# Patient Record
Sex: Male | Born: 1935 | Race: White | Hispanic: No | Marital: Married | Smoking: Never smoker
Health system: Southern US, Community
[De-identification: ages and names within clinical notes are randomized; demographics above are authoritative.]

## PROBLEM LIST (undated history)

## (undated) DIAGNOSIS — R29898 Other symptoms and signs involving the musculoskeletal system: Secondary | ICD-10-CM

## (undated) DIAGNOSIS — H3530 Unspecified macular degeneration: Secondary | ICD-10-CM

## (undated) DIAGNOSIS — H3320 Serous retinal detachment, unspecified eye: Secondary | ICD-10-CM

## (undated) DIAGNOSIS — C4491 Basal cell carcinoma of skin, unspecified: Secondary | ICD-10-CM

## (undated) DIAGNOSIS — K862 Cyst of pancreas: Secondary | ICD-10-CM

## (undated) DIAGNOSIS — R600 Localized edema: Secondary | ICD-10-CM

## (undated) DIAGNOSIS — I6529 Occlusion and stenosis of unspecified carotid artery: Secondary | ICD-10-CM

## (undated) DIAGNOSIS — R972 Elevated prostate specific antigen [PSA]: Secondary | ICD-10-CM

## (undated) DIAGNOSIS — M353 Polymyalgia rheumatica: Secondary | ICD-10-CM

## (undated) DIAGNOSIS — M48061 Spinal stenosis, lumbar region without neurogenic claudication: Secondary | ICD-10-CM

## (undated) DIAGNOSIS — I10 Essential (primary) hypertension: Secondary | ICD-10-CM

## (undated) DIAGNOSIS — Z87448 Personal history of other diseases of urinary system: Secondary | ICD-10-CM

## (undated) DIAGNOSIS — C4490 Unspecified malignant neoplasm of skin, unspecified: Secondary | ICD-10-CM

## (undated) DIAGNOSIS — R609 Edema, unspecified: Secondary | ICD-10-CM

## (undated) DIAGNOSIS — M1990 Unspecified osteoarthritis, unspecified site: Secondary | ICD-10-CM

## (undated) DIAGNOSIS — I499 Cardiac arrhythmia, unspecified: Secondary | ICD-10-CM

## (undated) HISTORY — PX: EYE SURGERY: SHX253

## (undated) HISTORY — DX: Occlusion and stenosis of unspecified carotid artery: I65.29

## (undated) HISTORY — PX: CARDIOVERSION: SHX1299

## (undated) HISTORY — DX: Elevated prostate specific antigen (PSA): R97.20

## (undated) HISTORY — DX: Other symptoms and signs involving the musculoskeletal system: R29.898

## (undated) HISTORY — DX: Essential (primary) hypertension: I10

## (undated) HISTORY — PX: FEMUR FRACTURE SURGERY: SHX633

## (undated) HISTORY — DX: Basal cell carcinoma of skin, unspecified: C44.91

## (undated) HISTORY — DX: Polymyalgia rheumatica: M35.3

## (undated) HISTORY — DX: Unspecified macular degeneration: H35.30

---
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
## 1983-06-14 HISTORY — PX: BACK SURGERY: SHX140

## 1993-06-13 HISTORY — PX: BRAIN SURGERY: SHX531

## 1999-08-09 ENCOUNTER — Encounter: Payer: Self-pay | Admitting: *Deleted

## 1999-08-09 ENCOUNTER — Ambulatory Visit (HOSPITAL_COMMUNITY): Admission: RE | Admit: 1999-08-09 | Discharge: 1999-08-09 | Payer: Self-pay | Admitting: *Deleted

## 2005-04-04 ENCOUNTER — Ambulatory Visit: Payer: Self-pay | Admitting: Cardiology

## 2006-03-22 ENCOUNTER — Ambulatory Visit: Payer: Self-pay | Admitting: Cardiology

## 2006-03-31 ENCOUNTER — Encounter: Payer: Self-pay | Admitting: Internal Medicine

## 2006-03-31 ENCOUNTER — Ambulatory Visit: Payer: Self-pay

## 2006-07-31 ENCOUNTER — Ambulatory Visit: Payer: Self-pay | Admitting: Cardiology

## 2007-03-23 ENCOUNTER — Ambulatory Visit: Payer: Self-pay | Admitting: Cardiology

## 2007-06-14 HISTORY — PX: HIP SURGERY: SHX245

## 2007-09-12 ENCOUNTER — Ambulatory Visit: Payer: Self-pay | Admitting: Cardiology

## 2007-10-11 ENCOUNTER — Ambulatory Visit: Payer: Self-pay | Admitting: Cardiology

## 2007-11-18 ENCOUNTER — Ambulatory Visit: Payer: Self-pay | Admitting: Cardiology

## 2007-11-19 ENCOUNTER — Inpatient Hospital Stay (HOSPITAL_COMMUNITY): Admission: AD | Admit: 2007-11-19 | Discharge: 2007-11-23 | Payer: Self-pay | Admitting: Orthopedic Surgery

## 2007-11-19 ENCOUNTER — Ambulatory Visit: Payer: Self-pay | Admitting: Cardiology

## 2007-11-23 ENCOUNTER — Inpatient Hospital Stay: Admission: AD | Admit: 2007-11-23 | Discharge: 2007-12-01 | Payer: Self-pay | Admitting: Internal Medicine

## 2008-06-13 HISTORY — PX: HIP SURGERY: SHX245

## 2008-12-23 DIAGNOSIS — I10 Essential (primary) hypertension: Secondary | ICD-10-CM | POA: Insufficient documentation

## 2008-12-23 DIAGNOSIS — C4490 Unspecified malignant neoplasm of skin, unspecified: Secondary | ICD-10-CM | POA: Insufficient documentation

## 2008-12-23 DIAGNOSIS — I6529 Occlusion and stenosis of unspecified carotid artery: Secondary | ICD-10-CM | POA: Insufficient documentation

## 2008-12-23 DIAGNOSIS — H3530 Unspecified macular degeneration: Secondary | ICD-10-CM | POA: Insufficient documentation

## 2008-12-23 DIAGNOSIS — I4891 Unspecified atrial fibrillation: Secondary | ICD-10-CM | POA: Insufficient documentation

## 2008-12-23 DIAGNOSIS — M353 Polymyalgia rheumatica: Secondary | ICD-10-CM | POA: Insufficient documentation

## 2008-12-23 DIAGNOSIS — R972 Elevated prostate specific antigen [PSA]: Secondary | ICD-10-CM | POA: Insufficient documentation

## 2008-12-31 ENCOUNTER — Ambulatory Visit: Payer: Self-pay | Admitting: Cardiology

## 2009-03-06 ENCOUNTER — Encounter: Payer: Self-pay | Admitting: Emergency Medicine

## 2009-03-07 ENCOUNTER — Inpatient Hospital Stay (HOSPITAL_COMMUNITY): Admission: EM | Admit: 2009-03-07 | Discharge: 2009-03-11 | Payer: Self-pay | Admitting: Orthopedic Surgery

## 2009-03-07 ENCOUNTER — Ambulatory Visit: Payer: Self-pay | Admitting: Cardiology

## 2009-03-09 ENCOUNTER — Ambulatory Visit: Payer: Self-pay | Admitting: Physical Medicine & Rehabilitation

## 2009-03-11 ENCOUNTER — Inpatient Hospital Stay (HOSPITAL_COMMUNITY)
Admission: RE | Admit: 2009-03-11 | Discharge: 2009-03-24 | Payer: Self-pay | Admitting: Physical Medicine & Rehabilitation

## 2009-03-11 ENCOUNTER — Ambulatory Visit: Payer: Self-pay | Admitting: Physical Medicine & Rehabilitation

## 2009-09-18 ENCOUNTER — Telehealth: Payer: Self-pay | Admitting: Cardiology

## 2009-09-28 ENCOUNTER — Encounter: Admission: RE | Admit: 2009-09-28 | Discharge: 2009-09-28 | Payer: Self-pay | Admitting: Orthopedic Surgery

## 2009-12-16 ENCOUNTER — Ambulatory Visit: Payer: Self-pay | Admitting: Cardiology

## 2010-06-13 HISTORY — PX: INGUINAL HERNIA REPAIR: SUR1180

## 2010-07-13 NOTE — Progress Notes (Signed)
Summary: procedure/stop coumadin  Phone Note From Other Clinic Call back at [phone]   Caller: Roberta/[facility] Summary of Call: having a CT Arthrogram(not schedule), needs to stop coumadin 3 days prior Initial call taken by: Migdalia Dk,  September 18, 2009 2:26 PM  Follow-up for Phone Call        ok Follow-up by: Gaylord Shih, MD, [facility],  September 22, 2009 9:40 AM     Appended Document: procedure/stop coumadin FAXED NOTE TO [place] [facility] LEFT MESSAGE IF DIDN'T RECIEVE TO CALL OFFICE./CY

## 2010-07-13 NOTE — Assessment & Plan Note (Signed)
Summary: [name]   Visit Type:  77 yr  Primary Provider:  [place] Ouch  CC:  pt recently had shingles.....sob...edema/legs...broke right hip 03/05/09.....denies any cp.  History of Present Illness: [name] returns today for evaluation and management his chronic atrial fibrillation, anticoagulation, hypertension, and lower extremity edema.  He recent had shingles involving the left mid thorax. He's also broken his right hip from a fall in September. He's walking on a cane. His balance is a little unsteady but he has had no further falls. His wife confirms this.  His Coumadin is followed by his primary care in [place]. He reports no hemorrhagic complications.  He does complain of some edema. He was given Lasix and potassium but he rarely takes it.  Current Medications (verified): 1)  Vitamin B-12 1000 Mcg Tabs (Cyanocobalamin) .[name] [name].. 1 Tab Once Daily 2)  Coumadin 5 Mg Tabs (Warfarin Sodium) .... As Directed By [facility] Office 3)  Cardizem Cd 240 Mg Xr24h-Cap (Diltiazem Hcl Coated Beads) .[name] [name].. 1 Tab Once Daily 4)  Calcium-Vitamin D 500-200 Mg-Unit Tabs (Calcium Carbonate-Vitamin D) .[name] [name].. 1 Tab Two Times A Day 5)  Vision Formula  Tabs (Multiple Vitamins-Minerals) .[name] [name].. 1 Tab Once Daily 6)  Benazepril Hcl 10 Mg Tabs (Benazepril Hcl) .[name] [name].. 1 Tab Once Daily 7)  Klor-Con 10 10 Meq Cr-Tabs (Potassium Chloride) .... As Needed 8)  Furosemide 40 Mg Tabs (Furosemide) .... As Needed  Allergies: 1)  ! Pcn  Past History:  Past Medical History: Last updated: Dec 30, 2008 ATRIAL FIBRILLATION (ICD-427.31) COUMADIN THERAPY (ICD-V58.61) CAROTID ARTERY STENOSIS (ICD-433.10) HYPERTENSION, UNSPECIFIED (ICD-401.9) PSA, INCREASED (ICD-790.93) POLYMYALGIA RHEUMATICA (ICD-725) MACULAR DEGENERATION (ICD-362.50) EPISTAXIS/HX OF (ICD-784.7) CARCINOMA, BASAL CELL (ICD-173.9) WEAKNESS/LOWER EXTREMITY (ICD-780.79) * HISTORY OF SMALL RIGHT MCA LACUNAR STROKE    Past Surgical History: Last updated: 12-30-2008 Open  reduction and internal fixation of the left  intertrochanteric femur fracture  [name] [name]. [name] [name], M.D.   Family History: Last updated: 12/30/2008 Father: Deceased in 30 of noncardiac causes. Mother: Deceased at age 75 due to stroke  Social History: Last updated: 12/30/08 Retired from [employer] Married  Tobacco Use - No.  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   Risk Factors: Smoking Status: never (2008/12/30)  Review of Systems       negative other than history of present illness.  Vital Signs:  Patient profile:   77 year old male Height:      69 inches Weight:      152 pounds BMI:     22.53 Pulse rate:   57 / minute Pulse rhythm:   irregular BP sitting:   128 / 70  (left arm) Cuff size:   large  Vitals Entered By: Danielle Rankin, CMA (December 16, 2009 2:21 PM)  Physical Exam  General:  frail, alert oriented x3 Head:  normocephalic and atraumatic Eyes:  PERRLA/EOM intact; conjunctiva and lids normal. Neck:  Neck supple, no JVD. No masses, thyromegaly or abnormal cervical nodes. Lungs:  Clear bilaterally to auscultation and percussion. Heart:  PMI nondisplaced, regular rate and rhythm, no gallop left carotid bruit Msk:  decreased ROM.   Pulses:  pulses normal in all 4 extremities Extremities:  1+ left pedal edema and 1+ right pedal edema.   Neurologic:  Alert and oriented x 3. Skin:  Intact without lesions or rashes. Psych:  Normal affect.   Impression & Recommendations:  Problem # 1:  ATRIAL FIBRILLATION (ICD-427.31) Assessment Unchanged  The following medications  were removed from the medication list:    Aspirin 81 Mg Tbec (Aspirin) .[name] [name]... Take one tablet by mouth daily His updated medication list for this problem includes:    Coumadin 5 Mg Tabs (Warfarin sodium) .[name] [name]... As directed by [place] [facility]  Orders: EKG w/ Interpretation (93000)  Problem # 2:  COUMADIN THERAPY (ICD-V58.61) Assessment:  Unchanged  Problem # 3:  CAROTID ARTERY STENOSIS (ICD-433.10) Assessment: Unchanged  The following medications were removed from the medication list:    Aspirin 81 Mg Tbec (Aspirin) .[name] [name]... Take one tablet by mouth daily His updated medication list for this problem includes:    Coumadin 5 Mg Tabs (Warfarin sodium) .[name] [name]... As directed by [place] office  Problem # 4:  HYPERTENSION, UNSPECIFIED (ICD-401.9)  The following medications were removed from the medication list:    Aspirin 81 Mg Tbec (Aspirin) .[name] [name]... Take one tablet by mouth daily His updated medication list for this problem includes:    Cardizem Cd 240 Mg Xr24h-cap (Diltiazem hcl coated beads) .[name] [name]... 1 tab once daily    Benazepril Hcl 10 Mg Tabs (Benazepril hcl) .[name] [name]... 1 tab once daily    Furosemide 40 Mg Tabs (Furosemide) .[name] [name]... As needed  Patient Instructions: 1)  Your physician recommends that you schedule a follow-up appointment in: YEAR WITH DR [name] [name] 2)  Your physician has recommended you make the following change in your medication: CONT TO TAKE KCL AND FUROSEMIDE AS NEEDED  Appended Document: [facility]     Allergies: 1)  ! Pcn   EKG  Procedure date:  12/16/2009  Findings:      chronic atrial fibrillation, no acute changes

## 2010-09-16 LAB — PROTIME-INR
INR: 2.06 — ABNORMAL HIGH (ref 0.00–1.49)
INR: 2.21 — ABNORMAL HIGH (ref 0.00–1.49)
INR: 2.61 — ABNORMAL HIGH (ref 0.00–1.49)
INR: 3.19 — ABNORMAL HIGH (ref 0.00–1.49)
INR: 3.19 — ABNORMAL HIGH (ref 0.00–1.49)
INR: 3.2 — ABNORMAL HIGH (ref 0.00–1.49)
INR: 3.66 — ABNORMAL HIGH (ref 0.00–1.49)
Prothrombin Time: 23.8 seconds — ABNORMAL HIGH (ref 11.6–15.2)
Prothrombin Time: 24.3 seconds — ABNORMAL HIGH (ref 11.6–15.2)
Prothrombin Time: 26.8 seconds — ABNORMAL HIGH (ref 11.6–15.2)
Prothrombin Time: 28.9 seconds — ABNORMAL HIGH (ref 11.6–15.2)
Prothrombin Time: 32.1 seconds — ABNORMAL HIGH (ref 11.6–15.2)
Prothrombin Time: 32.4 seconds — ABNORMAL HIGH (ref 11.6–15.2)
Prothrombin Time: 32.5 seconds — ABNORMAL HIGH (ref 11.6–15.2)
Prothrombin Time: 36.1 seconds — ABNORMAL HIGH (ref 11.6–15.2)

## 2010-09-16 LAB — CBC
HCT: 29.6 % — ABNORMAL LOW (ref 39.0–52.0)
Hemoglobin: 9.4 g/dL — ABNORMAL LOW (ref 13.0–17.0)
MCHC: 33.7 g/dL (ref 30.0–36.0)
MCV: 91.3 fL (ref 78.0–100.0)
RDW: 15.2 % (ref 11.5–15.5)
WBC: 13.8 10*3/uL — ABNORMAL HIGH (ref 4.0–10.5)

## 2010-09-16 LAB — HEMOGLOBIN AND HEMATOCRIT, BLOOD
HCT: 27.8 % — ABNORMAL LOW (ref 39.0–52.0)
HCT: 29.4 % — ABNORMAL LOW (ref 39.0–52.0)
Hemoglobin: 9.4 g/dL — ABNORMAL LOW (ref 13.0–17.0)

## 2010-09-16 LAB — URINALYSIS, ROUTINE W REFLEX MICROSCOPIC
Bilirubin Urine: NEGATIVE
Glucose, UA: NEGATIVE mg/dL
Ketones, ur: NEGATIVE mg/dL
Nitrite: NEGATIVE
Urobilinogen, UA: 1 mg/dL (ref 0.0–1.0)

## 2010-09-16 LAB — URINE CULTURE: Colony Count: 25000

## 2010-09-17 LAB — CBC
HCT: 30.6 % — ABNORMAL LOW (ref 39.0–52.0)
HCT: 31.3 % — ABNORMAL LOW (ref 39.0–52.0)
HCT: 28.5 % — ABNORMAL LOW (ref 39.0–52.0)
Hemoglobin: 10.9 g/dL — ABNORMAL LOW (ref 13.0–17.0)
Hemoglobin: 10.6 g/dL — ABNORMAL LOW (ref 13.0–17.0)
MCHC: 33.9 g/dL (ref 30.0–36.0)
MCHC: 33.7 g/dL (ref 30.0–36.0)
MCV: 91.2 fL (ref 78.0–100.0)
Platelets: 157 10*3/uL (ref 150–400)
Platelets: 173 10*3/uL (ref 150–400)
Platelets: 188 10*3/uL (ref 150–400)
RBC: 3.39 MIL/uL — ABNORMAL LOW (ref 4.22–5.81)
RBC: 3.51 MIL/uL — ABNORMAL LOW (ref 4.22–5.81)
RBC: 3.42 MIL/uL — ABNORMAL LOW (ref 4.22–5.81)
RDW: 15.4 % (ref 11.5–15.5)
RDW: 14.8 % (ref 11.5–15.5)
WBC: 12.1 10*3/uL — ABNORMAL HIGH (ref 4.0–10.5)
WBC: 15.7 10*3/uL — ABNORMAL HIGH (ref 4.0–10.5)
WBC: 17.3 10*3/uL — ABNORMAL HIGH (ref 4.0–10.5)
WBC: 12.6 10*3/uL — ABNORMAL HIGH (ref 4.0–10.5)

## 2010-09-17 LAB — BASIC METABOLIC PANEL
BUN: 26 mg/dL — ABNORMAL HIGH (ref 6–23)
CO2: 27 mEq/L (ref 19–32)
Calcium: 7.7 mg/dL — ABNORMAL LOW (ref 8.4–10.5)
Chloride: 107 mEq/L (ref 96–112)
Creatinine, Ser: 0.96 mg/dL (ref 0.4–1.5)
GFR calc Af Amer: >60 mL/min (ref >60)
GFR calc non Af Amer: >60 mL/min (ref >60)
GFR calc non Af Amer: >60 mL/min (ref >60)
Glucose, Bld: 160 mg/dL — ABNORMAL HIGH (ref 70–99)
Potassium: 3.8 mEq/L (ref 3.5–5.1)
Potassium: 3.9 mEq/L (ref 3.5–5.1)
Potassium: 3.9 mEq/L (ref 3.5–5.1)
Sodium: 136 mEq/L (ref 135–145)
Sodium: 139 mEq/L (ref 135–145)

## 2010-09-17 LAB — CROSSMATCH: Antibody Screen: NEGATIVE

## 2010-09-17 LAB — DIFFERENTIAL
Basophils Absolute: 0 10*3/uL (ref 0.0–0.1)
Basophils Absolute: 0 10*3/uL (ref 0.0–0.1)
Basophils Relative: 0 % (ref 0–1)
Basophils Relative: 0 % (ref 0–1)
Eosinophils Absolute: 0.6 10*3/uL (ref 0.0–0.7)
Eosinophils Absolute: 0 10*3/uL (ref 0.0–0.7)
Eosinophils Relative: 0 % (ref 0–5)
Lymphs Abs: 0.8 10*3/uL (ref 0.7–4.0)
Monocytes Relative: 11 % (ref 3–12)
Neutro Abs: 8 10*3/uL — ABNORMAL HIGH (ref 1.7–7.7)
Neutrophils Relative %: 72 % (ref 43–77)
Neutrophils Relative %: 88 % — ABNORMAL HIGH (ref 43–77)

## 2010-09-17 LAB — COMPREHENSIVE METABOLIC PANEL
ALT: 17 U/L (ref 0–53)
ALT: 15 U/L (ref 0–53)
AST: 22 U/L (ref 0–37)
Albumin: 3.3 g/dL — ABNORMAL LOW (ref 3.5–5.2)
Alkaline Phosphatase: 69 U/L (ref 39–117)
Alkaline Phosphatase: 94 U/L (ref 39–117)
BUN: 21 mg/dL (ref 6–23)
BUN: 13 mg/dL (ref 6–23)
CO2: 26 mEq/L (ref 19–32)
Chloride: 104 mEq/L (ref 96–112)
GFR calc Af Amer: >60 mL/min (ref >60)
GFR calc non Af Amer: >60 mL/min (ref >60)
Glucose, Bld: 103 mg/dL — ABNORMAL HIGH (ref 70–99)
Potassium: 4 mEq/L (ref 3.5–5.1)
Potassium: 3.5 mEq/L (ref 3.5–5.1)
Sodium: 137 mEq/L (ref 135–145)
Total Bilirubin: 0.7 mg/dL (ref 0.3–1.2)
Total Protein: 5.6 g/dL — ABNORMAL LOW (ref 6.0–8.3)
Total Protein: 6.5 g/dL (ref 6.0–8.3)

## 2010-09-17 LAB — URINALYSIS, MICROSCOPIC ONLY
Ketones, ur: NEGATIVE mg/dL
Leukocytes, UA: NEGATIVE
Nitrite: NEGATIVE
Protein, ur: NEGATIVE mg/dL

## 2010-09-17 LAB — HEMOGLOBIN AND HEMATOCRIT, BLOOD
HCT: 32.2 % — ABNORMAL LOW (ref 39.0–52.0)
Hemoglobin: 8.9 g/dL — ABNORMAL LOW (ref 13.0–17.0)

## 2010-09-17 LAB — URINE CULTURE: Culture: NO GROWTH

## 2010-09-17 LAB — PROTIME-INR
INR: 1.2 (ref 0.00–1.49)
INR: 1.3 (ref 0.00–1.49)
INR: 1.8 — ABNORMAL HIGH (ref 0.00–1.49)
INR: 1.3 (ref 0.00–1.49)
Prothrombin Time: 15 seconds (ref 11.6–15.2)
Prothrombin Time: 16 seconds — ABNORMAL HIGH (ref 11.6–15.2)
Prothrombin Time: 16.1 seconds — ABNORMAL HIGH (ref 11.6–15.2)
Prothrombin Time: 20.5 seconds — ABNORMAL HIGH (ref 11.6–15.2)
Prothrombin Time: 16.4 seconds — ABNORMAL HIGH (ref 11.6–15.2)

## 2010-09-17 LAB — POCT CARDIAC MARKERS
CKMB, poc: 1.9 ng/mL (ref 1.0–8.0)
Troponin i, poc: <0.05 ng/mL (ref 0.00–0.09)

## 2010-10-26 NOTE — Assessment & Plan Note (Signed)
[facility]                                 ON-CALL NOTE   NAME:Gomes, THOMOS                      MRN:          [id number]  DATE:11/18/2007                            DOB:          03-29-1922    DATE OF PHONE CALL:  November 18, 2007 at approximately 1920.   SUPERVISING PHYSICIAN:  Dr. Antoine Poche.   PRIMARY CARDIOLOGIST:  Dr. Daleen Squibb.   SUMMARY OF HISTORY:  Ms. Elenore Paddy, daughter of Lex, Linhares., patient of Dr. Daleen Squibb, date of birth, 1922/01/23, called stating  that Mr. Plumb was admitted to [facility] after a fall for  which he sustained a hip fracture.  The daughter was very upset, stating  that they plan to do orthopedic surgery on Monday morning; however, Dr.  Daleen Squibb had informed the patient and the family that he is not to have  surgery performed given his heart condition..  I further questioned Ms.  Benna Dunks if she relayed her concerns to Mr. Fulcherr's admitting physician  at [facility].  I asked this several different times in several  different ways.  She never answered the question.  She insisted that he  be transferred to [facility] for any treatment.  I explained to her that  she needs to relate her concerns to the attending physician.  Given the  family's request, then the attending physician can at that time make  arrangements for the patient to be transferred to [facility], so that we may  follow the patient given any necessity for orthopedic history.  She was  not very happy with that and became very tearful.  She stated that she  requested Dr. Vern Claude home phone number so that she may call him at home.  I explained to her that, that was not permissible, that Dr. Daleen Squibb more  not be working.  He is not working on Sunday evening, that he was  scheduled to return to work tomorrow morning at approximately 7:30 a.m.,  and I would be happy to relay her concerns to Dr. Daleen Squibb.  I also advised  her that she should relay her  concerns to the attending physician and  that he would be able to take care of any necessary transfer.  She was  not happy with the situation, but I explained to her that this was not  in my control.  I informed her that I would give Dr. Daleen Squibb the  information tomorrow morning, that he would probably contact the  cardiologist at [facility] for consultation.   The following morning on Monday, November 19, 2007, I spoke with Dr. Daleen Squibb at  approximately 7:10 to relay the above information.  However, he stated  he was already aware of this situation because apparently Ms. Benna Dunks  called him late Sunday night at home.     Joellyn Rued, PA-C  Electronically Signed    EW/MedQ  DD: 11/19/2007  DT: 11/19/2007  Job #: [id number]

## 2010-10-26 NOTE — Assessment & Plan Note (Signed)
[facility]                            CARDIOLOGY OFFICE NOTE   NAME:[name] [name], [name] [name]                    MRN:          [id number]  DATE:09/12/2007                            DOB:          December 27, 1921    [name] [name] returns today for management of following issues:  1. Chronic atrial fib.  2. Anticoagulation, followed in [place].  3. Hypertension.   He is not having any difficulties whatsoever.  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  He did not get his hernia repaired as he had set out to  do the last time I saw him in October of 2007.  A 2D echocardiogram at  that time was unremarkable, with an EF of 60%, a normal wall thickness,  no segmental wall motion abnormalities, mild left atrial enlargement,  right atrial enlargement.   He denies orthopnea, PND, peripheral edema, tachy palpitations, chest  discomfort, angina, lower extremity edema.   His meds today are B12, Coumadin as directed, Cardizem CD 240 mg a day,  prednisone 5 mg every other day, calcium and D, aspirin 81 mg day,  benazepril 10 mg a day.   His exam: He is an elderly gentleman in no acute distress.  Skin is warm and dry.  His blood pressure is 114/62, pulse 59 and irregular.  His weight is  153.  HEENT:  Normocephalic, atraumatic.  PERRLA.  Extraocular movements  intact.  Sclerae are slightly injected.  Facial symmetry is normal.  Dentition is in need of repair.  Carotids upstrokes are equal bilaterally with a soft right carotid bruit  that I think is new.  Thyroid is not enlarged.  Trachea is midline.  LUNGS:  Clear.  HEART:  Reveals an irregular rate and rhythm.  ABDOMEN:  Soft.  Good bowel sounds.  No midline bruit.  No hepatomegaly.  EXTREMITIES:  No cyanosis, clubbing or edema.  Pulses were present but  reduced.  He has reduced capillary refill.  He is cutting the nail beds  way too close.  He has a slight hangnail on the left big toe.  NEURO:  Exam is intact.   ASSESSMENT/PLAN:  Mr. Arterberry is doing well.  I think he has a new right  carotid bruit.  Will obtain carotid Dopplers.  He would like to get a  tooth pulled and I have asked him to hold Coumadin for 1 day and his  aspirin for 5 days.  He will resume afterwards.  He will continue to  follow along with Dr. Sherril Croon, his new primary care physician in [place].     Thomas C. Daleen Squibb, MD, [facility]  Electronically Signed   TCW/MedQ  DD: 09/12/2007  DT: 09/12/2007  Job #: [id number]

## 2010-10-26 NOTE — Assessment & Plan Note (Signed)
[facility]                            CARDIOLOGY OFFICE NOTE   NAME:[name], [name] KEPNER                    MRN:          [id number]  DATE:03/23/2007                            DOB:          08/27/1921    [name] [name] returns today for further management of the following  issues.   1. Chronic atrial fibrillation.  2. Anticoagulation, followed in [place].   He brings in his numbers today, which look very consistent and good.  History of hypertension.   Since I last saw him, he had a light stroke that affected his left leg  in April of this year.  He was cared for by the late Dr. Winona Legato.  I  do not have any records.  His wife says it was a small vessel that had  clotted off.  I suspect it was an embolus.   1. He is now on aspirin 81 mg a day.  2. Benazepril 10 mg a day for his blood pressure.  3. Cardizem 240 mg a day.  4. Coumadin.   His leg is totally recovered with rehab.  He offers no complaints today.  His blood pressure is 152/86, his pulse is 80 and irregular.  He is in  atrial fibrillation.  His EKG is stable.  His weight is down 3 pounds to  155.  HEENT:  Normocephalic, atraumatic.  PERRLA.  Extraocular movements  intact.  Sclerae clear.  Facial symmetry is normal.  Carotid upstrokes are equal bilaterally, without bruits.  There is no  JVD.  Thyroid is not enlarged.  Trachea is midline.  LUNGS:  Clear.  HEART:  Reveals an irregular rate and rhythm, no gallop.  ABDOMINAL EXAM:  Soft, good bowel sounds.  No midline bruit.  EXTREMITIES:  Reveal no cyanosis, clubbing or edema.  Pulses are brisk.  NEUROLOGIC EXAM:  Grossly intact.   ASSESSMENT AND PLAN:  I have had a nice chat with [name] [name].  He needs to have a tooth pulled and wants to know if he can stop his  Coumadin.  I have advised against this with his history of a stroke  recently.  I suspect they can obtain hemostasis with compression or with  cautery.  I would not  stop his Coumadin unless absolutely necessary and  then only if he had Lovenox overlap.   I will plan on seeing him in six months.     Thomas C. Daleen Squibb, MD, [facility]  Electronically Signed    TCW/MedQ  DD: 03/23/2007  DT: 03/23/2007  Job #: [id number]   cc:   [name] [name]

## 2010-10-26 NOTE — H&P (Signed)
NAME:  [name] [name], [name] [name]             ACCOUNT NO.:  [id number]   MEDICAL RECORD NO.:  [id number]          PATIENT TYPE:  INP   LOCATION:  1432                         FACILITY:  [facility]   PHYSICIAN:  Leonides Grills, M.D.     DATE OF BIRTH:  January 08, 1922   DATE OF ADMISSION:  11/19/2007  DATE OF DISCHARGE:                              HISTORY & PHYSICAL   CHIEF COMPLAINT:  Left hip pain and left foot pain.   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  He lost his footing and injured his left  hip.  He was initially seen in [place] at [facility] on November 18, 2007.  He was found to have a left femoral neck fracture.  The patient  denied any chest pain, shortness of breath, loss of consciousness,  syncopal episodes associated with the fall.   The patient has a past medical history notable for permanent atrial  fibrillation without known cardiac disease.  The patient is on chronic  Coumadin and had an INR of 2.0 on admission at [facility] and  received 10 mg subcutaneous vitamin K.  The only other complaint at this  time is left foot pain, reports radiographs negative at [facility] of the left foot or ankle.  He is unsure which was taken or if  both were taken, and he says these films are not available, and the left  hip films are not available at this time.   ALLERGIES:  PENICILLIN.   MEDICATIONS:  1. Aspirin 81 mg 1 daily.  2. Coumadin 2.5 mg/5 mg alternating days.  3. Benazepril 10 mg 1 daily.  4. Cardizem CD 240 mg 1 daily.  5. Prednisone 5 mg every other day.   PAST MEDICAL HISTORY:  Positive for:  1. Atrial fibrillation on chronic Coumadin.  2. History of small right MCA lacunar stroke with associated lower      extremity weakness.  3. A 25% ICA stenosis by carotid Doppler in February 2008.  4. Remote bilateral subdural evacuation.  5.  Hypertension.  6. Basal cell carcinoma.  7. History of epistaxis.  8. Macular degeneration.  9. Elevated PSA.  10.Polymyalgia rheumatica on chronic prednisone.   SOCIAL HISTORY:  He is married and lives in [place] with his wife.  He is  retired from [employer].  He denies any tobacco or alcohol use.  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   REVIEW OF SYSTEMS:  He denies any chest pain, shortness of breath, loss  of consciousness, or bleeding gums or nose.  Positive for hypertension,  positive for atrial fibrillation on Coumadin. Positive for polymyalgia  rheumatica on chronic prednisone.   PHYSICAL EXAMINATION:  GENERAL:  The patient is awake, alert x3, no  acute distress.  HEAD:  Normocephalic with a small abrasion occipital region.  UPPER EXTREMITIES:  He had full range of motion of bilateral upper  extremities without pain.  Hands neurovascularly  intact.  He has small  abrasion to the olecranon region and left elbow.  LOWER EXTREMITIES:  Left leg externally rotated and shortened a bit.  Distal femur nontender.  Nontender left knee.  He has tenderness about  second through fourth metatarsal bases and lateral Lisfranc region.  Positive dorsal spine lateral foot.  Otherwise foot is neurovascularly  intact.  No swelling about the ankle.   IMPRESSION:  An 77 year old male status post fall with subsequent left  hip fracture.  Past Medical History pertinent for atrial fibrillation on  chronic Coumadin, also history of stroke and polymyalgia rheumatica on  chronic prednisone.   PLAN:  1. AP and lateral views of the left foot and ankle, rule out fracture.  2. Repeat left hip films as they are unavailable at this time.  3. Check CBC, BMET, PT, INR.  4. Dr. Daleen Squibb, from [facility], to see patient for preop      evaluation.  5. Dr. Charlann Boxer to perform ORIF of the left hip fracture once the patient      is cleared for surgery and PT/INR is within acceptable range.   6. The patient is admitted at this time for comfort and, again, preop      evaluation.      [name] [name], P.A.      Leonides Grills, M.D.  Electronically Signed    GC/MEDQ  D:  11/19/2007  T:  11/19/2007  Job:  [id number]

## 2010-10-26 NOTE — Discharge Summary (Signed)
NAME:  [name] [name], PAVON             ACCOUNT NO.:  [id number]   MEDICAL RECORD NO.:  [id number]          PATIENT TYPE:  INP   LOCATION:  1432                         [facility]ACILITY:  [facility]   PHYSICIAN:  [name]. Charlann Boxer, M.D.  DATE OF BIRTH:  03-26-1922   DATE OF ADMISSION:  11/19/2007  DATE OF DISCHARGE:  11/23/2007                               DISCHARGE SUMMARY   ADMITTING DIAGNOSES:  1. Atrial fibrillation, on chronic Coumadin.  2. History of small right MCA lacunar stroke with associated lower      extremity weakness.  3. Twenty-five percent internal carotid artery stenosis by carotid      Doppler in February 2008.  4. Remote bilateral subdural evacuation.  5. Hypertension.  6. Basal cell carcinoma.  7. History of epistaxis.  8. Macular degeneration.  9. Elevated PSA.  10.Polymyalgia rheumatica, on chronic prednisone.   DISCHARGE DIAGNOSIS:  1. Osteopenia with a left intertrochanteric fracture.  2. History of atrial fibrillation, on chronic Coumadin.  3. History of small right MCA lacunar stroke with associated lower      extremity weakness.  4. Twenty-five percent  internal carotid stenosis by carotid Doppler      in February 2008.  5. Remote bilateral subdural evacuation.  6. Hypertension.  7. Basal cell carcinoma.  8. History of epistaxis  9. Macular degeneration.  10.Elevated PCA.  11.Polymyalgia rheumatica, on chronic prednisone.   HISTORY OF PRESENT ILLNESS:  Mr. Klimaszewski is an 4 gentleman who fell off  a ladder at his home.  He was able to get himself to a family member's  house where he was brought to [facility] and was found have a  left femoral neck fracture.  Care was transferred to [facility] to Dr. Fonnie Jarvis who subsequently consulted with Dr. Charlann Boxer for  definitive management.  He has previously been on Coumadin and was given  vitamin K to help bring his INR under control to undergo surgery on the  9th.  He underwent a left open reduction and  internal fixation for  intratrochanteric fracture by Dr. Durene Romans.   LABS:  On admission, INR was 1.6, given vitamin K.  Prior to surgery,  INR was 1.4.  Coumadin was restarted afterwards.  At time of discharge.  INR was 1.1 and was subtherapeutic.  Admission hemoglobin/hematocrit was  11.4 and 33 respectively.  At the time of discharge, it was 10.6 and  30.7 with platelets of 197 and stable.  Metabolic panel on admission  showed his sodium to be 136, potassium 4.7, glucose 117, creatinine  0.93.  At the time of discharge, metabolic panel all within normal  limits.  His glucose was a little elevated at 127.  His sodium was 137,  potassium 4.9, creatinine 0.87, calcium was 8.4.   RADIOLOGY:  Ankle 3-view showed no gross abnormality, no acute fracture.  Left foot showed no fracture.  Preoperative left hip showed a left hip  intertrochanteric fracture, 3-part.  Of note as well, there was a right  inguinal hernia containing some bowel suspected of this left hip view.  This was a subsequent finding.  Operative left hip showed fixation of  intertrochanteric fracture.   CARDIOLOGY:  EKG performed before surgery.   HOSPITAL COURSE:  The patient was admitted through the emergency  department to our service, underwent internal open reduction and  internal fixation for an intratrochanteric fracture of the left hip by  Dr. Charlann Boxer and admitted to orthopedic floor.  He tolerated the procedure  well.  He remained hemodynamically stable throughout his course of stay.  The dressing was changed.  There was no significant drainage from the  wound, no evidence of infection.  He made a moderate amount of progress  with physical therapy. He still continued be a high fall risk, requiring  assistance with transfers and ambulation.  He was utilizing a rolling  walker.  Prior to discharge, he had some difficulty with his bowel  movement.  He was given medicines, softeners plus laxatives, to help  with this.   He was passing gas.  No difficulty urinating.   DISCHARGE DISPOSITION:  Discharged to a skilled nursing facility rehab  for further progress due to high fall risk, as well as strength with use  of a rolling walker.   DISCHARGE PHYSICAL THERAPY:  Goals of physical therapy will be to  increase his mobility with use of a rolling walker, decrease fall risk.  Hopefully, we will be able to transition him to a single-point cane in  several weeks.  Want to help work on his proprioception, as was well as  balance and strength.   PROCEDURE:  Open reduction and internal fixation for a left  intertrochanteric fracture by surgeon Dr. Durene Romans, assistant Dwyane Luo, PA-C.   DISCHARGE MEDICATIONS:  1. Aspirin 81 mg daily.  2. Coumadin 2.5 mg on day 1 and 5 mg on day 2, alternate every day      between 2.5 and 5, maintain INR between 2 and 3.  3. Give Lovenox 40 mg subcu q. 24 until INR is therapeutic and then      stop Lovenox.  Do not give Lovenox and Coumadin at the same time,      once INR has reached a level of 1.6.  4. Benazepril 10 mg 1 p.o. daily.  5. Cardizem CD 240 mg 1 p.o. daily.  6. Prednisone 5 mg 1 p.o. q. every other day.  7. Robaxin 500 mg p.o. q. 6. p.r.n. muscle spasm pain.  8. Iron 325 mg 1 p.o. t.i.d. x2 weeks.  9. Colace 100 mg 1 p.o. b.i.d. constipation.  10.MiraLax 17 grams p.o. daily.  11.Senokot 1 to 2 tablets p.o. q. 6. or until bowel movement p.r.n..   DISCHARGE FOLLOWUP:  Followup with Dr. Charlann Boxer at phone number [phone] in  2 weeks for staple removal.   DISCHARGE DIET:  Heart healthy.   DISCHARGE WOUND CARE:  Keep wound dry, change dressing on a daily basis.     ______________________________  Yetta Glassman [address], [place]      [name]. Charlann Boxer, M.D.  Electronically Signed    BLM/MEDQ  D:  11/23/2007  T:  11/23/2007  Job:  [id number]   cc:   Rollene Rotunda, MD, [facility]  [address]  [address]  [place]  [place] [address]

## 2010-10-26 NOTE — Op Note (Signed)
NAME:  [name] [name], [name] [name]             ACCOUNT NO.:  [id number]   MEDICAL RECORD NO.:  [id number]          PATIENT TYPE:  INP   LOCATION:  1432                         FACILITY:  [facility]   PHYSICIAN:  [name]. Charlann Boxer, M.D.  DATE OF BIRTH:  01-Oct-1921   DATE OF PROCEDURE:  11/20/2007  DATE OF DISCHARGE:                               OPERATIVE REPORT   PREOPERATIVE DIAGNOSIS:  Comminuted left intertrochanteric femur  fracture.   POSTOPERATIVE DIAGNOSIS:  Comminuted left intertrochanteric femur  fracture.   PROCEDURE:  Open reduction and internal fixation of the left  intertrochanteric femur fracture utilizing a DePuy trochanteric nail 11  x 200 mm, 130 degrees, with 105-mm lag screw, 36-mm distal interlock.   SURGEON:  [name]. Charlann Boxer, M.D.   ASSISTANT:  Yetta Glassman. Mann, PA.   ANESTHESIA:  General.   BLOOD LOSS:  150 mL.   COMPLICATIONS:  None.   DRAINS:  None.   INDICATION FOR PROCEDURE:  [name] [name] is a very pleasant 77 year old  very active male who was out working in his yard when he fell off a  couple of steps.  He had immediate onset of pain and was able to drag  himself to a lawn mower to drive up to the house and eventually was  taken by EMS to [facility].  Radiographs revealed an  intertrochanteric fracture.  He had some complaints of left foot pain.  This was ruled out for fracture upon transfer to [facility].  He has a  cardiac history consistent with atrial fibrillation and based on the  medical history and cardiology, he was seen and evaluated by them and  cleared for surgery.  He is on Coumadin for chronic atrial fibrillation,  and this was stopped.  His INR the day of surgery was 1.4.   Risks and benefits of the operation were reviewed with him and his  family.  Consent was obtained.   PROCEDURE IN DETAIL:  The patient was brought to operative theater.  Once adequate anesthesia, preoperative antibiotics, vancomycin due to  PENICILLIN allergy, were  administered, the patient was positioned supine  on the fracture table.  The right lower extremity was flexed and  abducted out of the way with bony prominences padded.  The left lower  extremity was placed the traction shoe and traction and internal  rotation applied.  Once positioned under fluoroscopic imaging, the  fracture was reduced in the AP and lateral planes as best as possible  due to the comminuted nature of it.  Once it was in a good anatomic  position, we prepped and draped the lateral thigh including shaving the  hair on this side.  A lateral-based incision was made in the proximal  trochanter.  A guidewire was then inserted in the tip of the trochanter  and passed into the hip.  I used a starting drill to open up the  proximal femur, then passed the nail by hand into its appropriate depth.  At this point I made an incision laterally for the lag screw guide.  It  was positioned on the lateral cortex of the femur  and then drilled into  the femur.  Radiographically I centered into the femoral head.  I  measured the depth, drilled, tapped and then placed a 105-mm lag screw.  I was able to compress a little bit; however, it was very snug in its  overall fit.   Following this a distal interlock was placed through the jig measuring  36 mm.  The wounds were all irrigated.  Final radiographs were obtained  with the jig off.  The proximal wound was closed in layers with #1  Vicryl on the gluteal fascia.  The remaining wounds were closed with 2-0  Vicryl and staples on the skin.  The skin was cleaned, dried and dressed  sterilely and dressed with Mepilex dressing and he was brought to the  recovery room extubated, tolerated the procedure well and returned back  to telemetry for monitoring for a day or so prior to returning to the  orthopedics floor or to plan on discharge to home versus skilled  facility for rehab, depending on therapy needs.      [name] Charlann Boxer, M.D.   Electronically Signed     MDO/MEDQ  D:  11/20/2007  T:  11/20/2007  Job:  [id number]

## 2010-10-29 NOTE — Assessment & Plan Note (Signed)
[facility]                              CARDIOLOGY OFFICE NOTE   NAME:[name] [name], [name] [name]                    MRN:          [id number]  DATE:03/22/2006                            DOB:          1922/01/30    Mr. Follett return today for management of his chronic atrial fib,  anticoagulation, and history of hypertension.   He offers no complaints today.  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   He does have a right inguinal hernia he wishes to get repaired electively.  I have recommended Dr. Cicero Duck per his request.   He is having no shortness of breath, chest pain, or tachy palpitations.  He  has no orthopnea or PND.  He does have some peripheral edema.  This is  mostly at the end of the day.   His meds are B12 1 daily, Coumadin as directed by Dr. Eliberto Ivory with excellent  INRs that he brings today, Cardizem CD 240 a day, prednisone 5 mg every  other day, calcium and vitamin D 500 b.i.d.   PHYSICAL EXAMINATION:  VITAL SIGNS:  Blood pressure is 134/78, pulse 65 and  irregular.  EKG is stable, confirming his chronic atrial fib.  Weight is  158, down 9.  He is in no acute distress.  HEENT:  Unremarkable.  NECK:  Carotids are full without bruits.  There is no JVD.  Thyroid is not  enlarged.  Trachea is midline.  LUNGS:  Clear.  HEART:  An irregular rate and rhythm.  No gallop.  ABDOMEN:  Soft.  There is no midline bruit.  There is no hepatomegaly.  Bowel sounds are present.  He has a right inguinal hernia.  EXTREMITIES:  No clubbing or cyanosis but there is 1+ edema.  Pulses are  brisk.  NEURO:  Intact.   ASSESSMENT/PLAN:  Mr. Isabell is stable from a cardiovascular standpoint.  His INR's have been therapeutic with Dr. Eliberto Ivory.   He needs elective repair of a right inguinal hernia .  I think we can clear  him for this if his left ventricular function is stable.  It has been  several years since we have done an  echocardiogram, so we will pursue with  this.   PLAN:  1. A 2D echo.  2. Continue current medications.  3. Clear for surgery when he so desires.       Thomas C. Daleen Squibb, MD, [facility]     TCW/MedQ  DD:  03/22/2006  DT:  03/23/2006  Job #:  [id number]   cc:   Weyman Pedro

## 2010-11-17 ENCOUNTER — Encounter: Payer: Self-pay | Admitting: Cardiology

## 2010-12-20 ENCOUNTER — Ambulatory Visit (INDEPENDENT_AMBULATORY_CARE_PROVIDER_SITE_OTHER): Payer: Medicare Other | Admitting: Cardiology

## 2010-12-20 ENCOUNTER — Encounter: Payer: Self-pay | Admitting: Cardiology

## 2010-12-20 VITALS — BP 138/67 | HR 70 | Resp 16 | Ht 69.0 in | Wt 143.0 lb

## 2010-12-20 DIAGNOSIS — I4891 Unspecified atrial fibrillation: Secondary | ICD-10-CM

## 2010-12-20 DIAGNOSIS — I6529 Occlusion and stenosis of unspecified carotid artery: Secondary | ICD-10-CM

## 2010-12-20 NOTE — Progress Notes (Signed)
HPI [name] comes in today for followup of his chronic atrial fibrillation. He also needs preoperative clearance for a right hydrocele/hernia repair.  He's having a lot of pain in the scrotal area and is having hard time urinating. His family would like him to have the surgery in [place] and ask that I arrange it.  He is having no palpitations, orthopnea, presyncope or syncope, but has had some edema in the lower extremities. He remains on Lasix. Reports no bleeding or melena.  He also denies any angina or ischemic symptoms.  EKG shows atrial fibrillation with nonspecific ST segment changes. Past Medical History  Diagnosis Date  . Atrial fibrillation   . Drug therapy     Coumadin  . Carotid artery stenosis   . Hypertension     Unspecified  . PSA (psoriatic arthritis)     Increased  . Polymyalgia rheumatica   . Macular degeneration   . Epistaxis   . Carcinomas, basal cell   . Weakness of both lower limbs   . Lacunar stroke     Histiry of small right MCA Lacunar Stroke    Past Surgical History  Procedure Date  . Femur fracture surgery     Open reduction and internal fixation of the left intertochanteric femur fracture. [name] Charlann Boxer, M.D.    Family History  Problem Relation Age of Onset  . Stroke Mother 59    History   Social History  . Marital Status: Married    Spouse Name: N/A    Number of Children: N/A  . Years of Education: N/A   Occupational History  . 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 Smoking status: Never Smoker   . Smokeless tobacco: Not on file  . Alcohol Use: No  . Drug Use: No  . Sexually Active: Not on file   Other Topics Concern  . Not on file   Social History Narrative  . No narrative on file    Allergies  Allergen Reactions  . Penicillins     Current Outpatient Prescriptions  Medication Sig Dispense Refill  . benazepril (LOTENSIN) 10 MG tablet Take 10 mg by  mouth daily.        . calcium-vitamin D (OSCAL WITH D) 500-200 MG-UNIT per tablet Take 1 tablet by mouth 2 (two) times daily.        Marland Kitchen diltiazem (CARDIZEM CD) 240 MG 24 hr capsule Take 240 mg by mouth daily.        . furosemide (LASIX) 40 MG tablet Take 40 mg by mouth as needed.        . Multiple Vitamins-Minerals (MULTIVITAMIN WITH MINERALS) tablet Take 1 tablet by mouth daily.        . potassium chloride (KLOR-CON M10) 10 MEQ tablet Take 10 mEq by mouth as needed.        . Tamsulosin HCl (FLOMAX) 0.4 MG CAPS Take by mouth.        . vitamin B-12 (CYANOCOBALAMIN) 1000 MCG tablet Take 1,000 mcg by mouth daily.        Marland Kitchen warfarin (COUMADIN) 5 MG tablet As directed by [facility] office         ROS Negative other than HPI.   PE General Appearance: well developed, well nourished in no acute distress, sitting in a wheelchair. HEENT: symmetrical face, PERRLA, good dentition  Neck: no JVD, thyromegaly, or adenopathy, trachea midline Chest: symmetric without deformity  Cardiac: PMI non-displaced, irregular rate and rhythm,normal S1, S2, no gallop or murmur Lung: clear to ausculation and percussion Vascular: all pulses full without bruits  Abdominal: nondistended, nontender, good bowel sounds, no HSM, no bruits Extremities: no cyanosis, clubbing or edema, no sign of DVT, no varicosities  Skin: normal color, no rashes Neuro: alert and oriented x 3, non-focal Pysch: normal affect Filed Vitals:   12/20/10 1216  BP: 138/67  Pulse: 70  Resp: 16  Height: 5\' 9"  (1.753 m)  Weight: 143 lb (64.864 kg)    EKG  Labs and Studies Reviewed.   Lab Results  Component Value Date   WBC 13.8* 03/20/2009   HGB 9.6* 03/23/2009   HCT 28.1* 03/23/2009   MCV 90.7 03/20/2009   PLT 375 03/20/2009      Chemistry      Component Value Date/Time   NA 137 03/12/2009 0620   K 4.0 03/12/2009 0620   CL 104 03/12/2009 0620   CO2 26 03/12/2009 0620   BUN 21 03/12/2009 0620   CREATININE 0.97 03/12/2009 0620      Component  Value Date/Time   CALCIUM 7.7* 03/12/2009 0620   ALKPHOS 69 03/12/2009 0620   AST 17 03/12/2009 0620   ALT 17 03/12/2009 0620   BILITOT 0.6 03/12/2009 0620       No results found for this basename: CHOL   No results found for this basename: HDL   No results found for this basename: LDLCALC   No results found for this basename: TRIG   No results found for this basename: CHOLHDL   No results found for this basename: HGBA1C   Lab Results  Component Value Date   ALT 17 03/12/2009   AST 17 03/12/2009   ALKPHOS 69 03/12/2009   BILITOT 0.6 03/12/2009   No results found for this basename: TSH

## 2010-12-20 NOTE — Assessment & Plan Note (Signed)
Will obtain 2-D echo to assess LV function prior to surgery.

## 2010-12-20 NOTE — Assessment & Plan Note (Signed)
He is stable and having no active symptoms. No need to obtain carotid Dopplers prior surgery.

## 2010-12-20 NOTE — Patient Instructions (Signed)
Your physician wants you to follow-up in: ONE YEAR WITH DR WALL  You will receive a reminder letter in the mail two months in advance. If you don't receive a letter, please call our office to schedule the follow-up appointment.   REFER TO DR Claud Kelp FOR EVAL FOR HERNIA REPAIR=WITH IN 2-3 WEEKS  Your physician has requested that you have an echocardiogram. Echocardiography is a painless test that uses sound waves to create images of your heart. It provides your doctor with information about the size and shape of your heart and how well your heart's chambers and valves are working. This procedure takes approximately one hour. There are no restrictions for this procedure.

## 2010-12-23 ENCOUNTER — Other Ambulatory Visit: Payer: Self-pay | Admitting: Cardiology

## 2010-12-23 DIAGNOSIS — K469 Unspecified abdominal hernia without obstruction or gangrene: Secondary | ICD-10-CM

## 2010-12-29 ENCOUNTER — Ambulatory Visit (HOSPITAL_COMMUNITY): Payer: Medicare Other | Attending: Cardiology | Admitting: Radiology

## 2010-12-29 DIAGNOSIS — I4891 Unspecified atrial fibrillation: Secondary | ICD-10-CM

## 2010-12-29 DIAGNOSIS — I079 Rheumatic tricuspid valve disease, unspecified: Secondary | ICD-10-CM | POA: Insufficient documentation

## 2010-12-29 DIAGNOSIS — I059 Rheumatic mitral valve disease, unspecified: Secondary | ICD-10-CM | POA: Insufficient documentation

## 2010-12-29 DIAGNOSIS — I10 Essential (primary) hypertension: Secondary | ICD-10-CM | POA: Insufficient documentation

## 2011-01-04 ENCOUNTER — Telehealth: Payer: Self-pay | Admitting: *Deleted

## 2011-01-04 NOTE — Telephone Encounter (Signed)
Pt wife is aware of echo results and pt cleared for possible hernia surgery.  Pt will be seeing Dr. Edythe Lynn this Friday. Copy of echo sent to Dr. Marye Round RN

## 2011-01-04 NOTE — Telephone Encounter (Signed)
Message copied by Theda Belfast on Tue Jan 04, 2011 10:38 AM ------      Message from: Valera Castle C      Created: Sun Jan 02, 2011  4:11 PM       ECHO STABLE, NORMAL LV FUNCTION, OK FOR SURGERY

## 2011-01-07 ENCOUNTER — Encounter (INDEPENDENT_AMBULATORY_CARE_PROVIDER_SITE_OTHER): Payer: Self-pay | Admitting: General Surgery

## 2011-01-07 ENCOUNTER — Ambulatory Visit (INDEPENDENT_AMBULATORY_CARE_PROVIDER_SITE_OTHER): Payer: Medicare Other | Admitting: General Surgery

## 2011-01-07 VITALS — BP 152/70 | Temp 98.5°F | Ht 69.0 in | Wt 156.0 lb

## 2011-01-07 DIAGNOSIS — K469 Unspecified abdominal hernia without obstruction or gangrene: Secondary | ICD-10-CM

## 2011-01-07 NOTE — Patient Instructions (Signed)
You have an incarcerated right inguinal hernia and possibly a hydrocele. This will need to be repaired surgically. We will schedule you for a CT scan of the pelvis to see what the contents of the hernia are We will also proceed with scheduling of this surgery. We will ask you to stop her Coumadin 5 days preop. We will check with Dr. Maisie Fus wall to be sure this is okay.

## 2011-01-07 NOTE — Progress Notes (Signed)
Subjective:     Patient ID: [name] [name]., male   DOB: 22-Jan-1922, 77 y.o.   MRN: [id number]  HPI This is an 77 year old Caucasian man from [place], referred to me through the courtesy of Dr. Lysle Rubens and Dr. Valera Castle for evaluation of a right groin and scrotal hernia.  The patient had a left inguinal hernia repair in 1953 and has had no known recurrence and feels fine. He has had a right groin mass and hernia for at least 5 years. It has been getting bigger. He was going to have this repaired one or 2 years ago but then suffered a hip fracture and had to put it off.  He is on Coumadin because of atrial fibrillation. His last colonoscopy was in 2010 and was told it was normal.  The right groin and scrotal hernia is getting bigger and is becoming much more uncomfortable. He's never had any abdominal pain nausea or vomiting associated with this but cannot completely reduce this. It is incarcerated. He is now ready to have this done and I have a note from Tom wall which states that he is clear from a cardiac standpoint.  Past Medical History  Diagnosis Date  . Atrial fibrillation   . Drug therapy     Coumadin  . Carotid artery stenosis   . Hypertension     Unspecified  . PSA (psoriatic arthritis)     Increased  . Polymyalgia rheumatica   . Macular degeneration   . Epistaxis   . Carcinomas, basal cell   . Weakness of both lower limbs   . Lacunar stroke     Histiry of small right MCA Lacunar Stroke    Current Outpatient Prescriptions  Medication Sig Dispense Refill  . benazepril (LOTENSIN) 10 MG tablet Take 10 mg by mouth daily.        . calcium-vitamin D (OSCAL WITH D) 500-200 MG-UNIT per tablet Take 1 tablet by mouth 2 (two) times daily.        Marland Kitchen diltiazem (CARDIZEM CD) 240 MG 24 hr capsule Take 240 mg by mouth daily.        . furosemide (LASIX) 40 MG tablet Take 40 mg by mouth as needed.        . Multiple Vitamins-Minerals (MULTIVITAMIN WITH MINERALS) tablet Take 1 tablet  by mouth daily.        . potassium chloride (KLOR-CON M10) 10 MEQ tablet Take 10 mEq by mouth as needed.        . Tamsulosin HCl (FLOMAX) 0.4 MG CAPS Take by mouth.        . vitamin B-12 (CYANOCOBALAMIN) 1000 MCG tablet Take 1,000 mcg by mouth daily.        Marland Kitchen warfarin (COUMADIN) 2 MG tablet Take 2 mg by mouth daily. Tuesday and Thursday       . warfarin (COUMADIN) 4 MG tablet Take 4 mg by mouth daily. Monday, Wednesday, Friday, Saturday and Sunday       . warfarin (COUMADIN) 5 MG tablet As directed by [facility] office         Allergies  Allergen Reactions  . Penicillins     Family History  Problem Relation Age of Onset  . Stroke Mother 25    History  Substance Use Topics  . Smoking status: Never Smoker   . Smokeless tobacco: Not on file  . Alcohol Use: Yes      Review of Systems  Constitutional: Negative.   Eyes: Negative.  Respiratory: Negative.   Cardiovascular: Positive for palpitations. Negative for chest pain and leg swelling.  Gastrointestinal: Negative.   Genitourinary: Positive for scrotal swelling and testicular pain. Negative for dysuria, urgency, frequency, hematuria, flank pain, enuresis, difficulty urinating and genital sores.  Musculoskeletal: Positive for arthralgias. Negative for back pain, joint swelling and gait problem.       Ankle edema  Skin: Negative.   Neurological: Negative.   Hematological: Negative.   Psychiatric/Behavioral: Negative.        Objective:   Physical Exam  Constitutional: He is oriented to person, place, and time. He appears well-nourished. No distress.  HENT:  Head: Atraumatic.  Mouth/Throat: No oropharyngeal exudate.  Eyes: Conjunctivae are normal. Pupils are equal, round, and reactive to light. Left eye exhibits no discharge.  Neck: Normal range of motion. Neck supple. No JVD present. No tracheal deviation present. No thyromegaly present.  Cardiovascular: Normal heart sounds.  Exam reveals no gallop and no friction rub.         Irreg. irreg rhythm due to atrial fibrillation  Pulmonary/Chest: Breath sounds normal. No respiratory distress. He has no wheezes. He has no rales.  Abdominal: Soft. Bowel sounds are normal. He exhibits mass. He exhibits no distension. There is no tenderness. There is no rebound and no guarding.  Genitourinary:    No penile tenderness.  Musculoskeletal: He exhibits edema. He exhibits no tenderness.  Lymphadenopathy:    He has no cervical adenopathy.  Neurological: He is alert and oriented to person, place, and time. He exhibits normal muscle tone.  Skin: Skin is warm and dry. No rash noted. No erythema. No pallor.  Psychiatric: He has a normal mood and affect. His behavior is normal. Judgment and thought content normal.       Assessment:     Incarcerated right inguinal hernia, possibly with associated hydrocele. This will need to be repaired because of progressive size, progressive pain, and risk of incarceration and strangulation.  Chronic atrial fibrillation on Coumadin.  Hypertension  Status post bilateral hip surgery for fracture.  Remote history of blood clot on brain, craniotomy with metal plates and he had.  Status post back surgery.  Status post left inguinal hernia repair 1953, and recurrence.    Plan:     We'll schedule for CT scan of pelvis to assess contents of hernia preop.  We'll proceed with scheduling of open repair of incarcerated right inguinal hernia and possible right hydrocelectomy.  I discussed the indications and details of surgery with the patient, his wife, and his daughter. Risks and complications have been outlined, including but limited to bleeding, infection, injury to adjacent organs such as the bladder or intestine with major reconstructive surgery, injury to the testicle, loss of the testicle, recurrence of the hernia, nerve damage chronic pain, cardiac pulmonary and thromboembolic problems. He seems to understand all these issues well and at this  time all of his questions were answered. He is in full agreement with this plan.  He will stop his Coumadin 5 days preop and we will check with Dr. Juanito Doom to be sure that this is appropriate.

## 2011-01-11 ENCOUNTER — Encounter: Payer: Self-pay | Admitting: Cardiology

## 2011-01-11 ENCOUNTER — Telehealth: Payer: Self-pay | Admitting: Cardiology

## 2011-01-11 NOTE — Telephone Encounter (Signed)
Faxed LOV, EKG & Echo. To Jasmine December at [facility] ([phone]).

## 2011-01-12 ENCOUNTER — Other Ambulatory Visit (INDEPENDENT_AMBULATORY_CARE_PROVIDER_SITE_OTHER): Payer: Self-pay | Admitting: General Surgery

## 2011-01-12 ENCOUNTER — Ambulatory Visit
Admission: RE | Admit: 2011-01-12 | Discharge: 2011-01-12 | Disposition: A | Discharge status: Home / Self Care | Payer: Medicare Other | Source: Ambulatory Visit | Attending: General Surgery | Admitting: General Surgery

## 2011-01-12 ENCOUNTER — Ambulatory Visit (HOSPITAL_COMMUNITY)
Admission: RE | Admit: 2011-01-12 | Discharge: 2011-01-12 | Disposition: A | Discharge status: Home / Self Care | Payer: Medicare Other | Source: Ambulatory Visit | Attending: General Surgery | Admitting: General Surgery

## 2011-01-12 ENCOUNTER — Encounter (HOSPITAL_COMMUNITY): Payer: Medicare Other

## 2011-01-12 DIAGNOSIS — I10 Essential (primary) hypertension: Secondary | ICD-10-CM | POA: Insufficient documentation

## 2011-01-12 DIAGNOSIS — Z01812 Encounter for preprocedural laboratory examination: Secondary | ICD-10-CM | POA: Insufficient documentation

## 2011-01-12 DIAGNOSIS — K469 Unspecified abdominal hernia without obstruction or gangrene: Secondary | ICD-10-CM

## 2011-01-12 DIAGNOSIS — Z01811 Encounter for preprocedural respiratory examination: Secondary | ICD-10-CM

## 2011-01-12 DIAGNOSIS — I517 Cardiomegaly: Secondary | ICD-10-CM | POA: Insufficient documentation

## 2011-01-12 DIAGNOSIS — K439 Ventral hernia without obstruction or gangrene: Secondary | ICD-10-CM | POA: Insufficient documentation

## 2011-01-12 LAB — DIFFERENTIAL
Basophils Absolute: 0 10*3/uL (ref 0.0–0.1)
Basophils Relative: 1 % (ref 0–1)
Eosinophils Absolute: 0.2 10*3/uL (ref 0.0–0.7)
Eosinophils Relative: 2 % (ref 0–5)
Neutrophils Relative %: 76 % (ref 43–77)

## 2011-01-12 LAB — URINALYSIS, ROUTINE W REFLEX MICROSCOPIC
Bilirubin Urine: NEGATIVE
Glucose, UA: NEGATIVE mg/dL
Ketones, ur: NEGATIVE mg/dL
Leukocytes, UA: NEGATIVE
Nitrite: NEGATIVE
Specific Gravity, Urine: 1.044 — ABNORMAL HIGH (ref 1.005–1.030)
pH: 5 (ref 5.0–8.0)

## 2011-01-12 LAB — CBC
MCH: 29.8 pg (ref 26.0–34.0)
MCHC: 32.3 g/dL (ref 30.0–36.0)
Platelets: 197 10*3/uL (ref 150–400)
RBC: 3.62 MIL/uL — ABNORMAL LOW (ref 4.22–5.81)
RDW: 14.8 % (ref 11.5–15.5)

## 2011-01-12 LAB — COMPREHENSIVE METABOLIC PANEL
ALT: 11 U/L (ref 0–53)
Albumin: 3.5 g/dL (ref 3.5–5.2)
Alkaline Phosphatase: 108 U/L (ref 39–117)
Calcium: 9 mg/dL (ref 8.4–10.5)
Potassium: 3.9 mEq/L (ref 3.5–5.1)
Sodium: 135 mEq/L (ref 135–145)
Total Protein: 7 g/dL (ref 6.0–8.3)

## 2011-01-12 LAB — PROTIME-INR: INR: 1.67 — ABNORMAL HIGH (ref 0.00–1.49)

## 2011-01-12 LAB — SURGICAL PCR SCREEN
MRSA, PCR: NEGATIVE
Staphylococcus aureus: NEGATIVE

## 2011-01-12 MED ORDER — IOHEXOL 300 MG/ML  SOLN
100.0000 mL | Freq: Once | INTRAMUSCULAR | Status: AC | PRN
  Administered 2011-01-12: 100 mL via INTRAVENOUS

## 2011-01-17 ENCOUNTER — Telehealth (INDEPENDENT_AMBULATORY_CARE_PROVIDER_SITE_OTHER): Payer: Self-pay | Admitting: General Surgery

## 2011-01-17 NOTE — Telephone Encounter (Signed)
Patient was called today and results of his CT scan were discussed. He is aware that the most important medical issue right now is repairing his large incarcerated hernia which contains large and small intestine. 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 I told him that I did doubted that he would be a candidate for pancreatic surgery in any event. He expressed understanding of this.

## 2011-01-18 ENCOUNTER — Ambulatory Visit (HOSPITAL_COMMUNITY)
Admission: RE | Admit: 2011-01-18 | Discharge: 2011-01-19 | Disposition: A | Discharge status: Home / Self Care | Payer: Medicare Other | Source: Ambulatory Visit | Attending: General Surgery | Admitting: General Surgery

## 2011-01-18 ENCOUNTER — Other Ambulatory Visit (INDEPENDENT_AMBULATORY_CARE_PROVIDER_SITE_OTHER): Payer: Self-pay | Admitting: General Surgery

## 2011-01-18 DIAGNOSIS — K4030 Unilateral inguinal hernia, with obstruction, without gangrene, not specified as recurrent: Secondary | ICD-10-CM | POA: Insufficient documentation

## 2011-01-18 DIAGNOSIS — Z7901 Long term (current) use of anticoagulants: Secondary | ICD-10-CM | POA: Insufficient documentation

## 2011-01-18 DIAGNOSIS — Z01818 Encounter for other preprocedural examination: Secondary | ICD-10-CM | POA: Insufficient documentation

## 2011-01-18 DIAGNOSIS — Z01812 Encounter for preprocedural laboratory examination: Secondary | ICD-10-CM | POA: Insufficient documentation

## 2011-01-18 DIAGNOSIS — K869 Disease of pancreas, unspecified: Secondary | ICD-10-CM | POA: Insufficient documentation

## 2011-01-18 DIAGNOSIS — I4891 Unspecified atrial fibrillation: Secondary | ICD-10-CM | POA: Insufficient documentation

## 2011-01-18 DIAGNOSIS — Z8673 Personal history of transient ischemic attack (TIA), and cerebral infarction without residual deficits: Secondary | ICD-10-CM | POA: Insufficient documentation

## 2011-01-18 DIAGNOSIS — I10 Essential (primary) hypertension: Secondary | ICD-10-CM | POA: Insufficient documentation

## 2011-01-18 DIAGNOSIS — N433 Hydrocele, unspecified: Secondary | ICD-10-CM

## 2011-01-18 LAB — PROTIME-INR: INR: 1.32 (ref 0.00–1.49)

## 2011-01-18 LAB — CANCER ANTIGEN 19-9: CA 19-9: 9.6 U/mL — ABNORMAL LOW (ref <35.0)

## 2011-01-19 NOTE — Op Note (Signed)
NAMEMarland Kitchen  [name], [name]             ACCOUNT NO.:  [id number]  MEDICAL RECORD NO.:  [id number]  LOCATION:  1607                         FACILITY:  [facility]  PHYSICIAN:  Angelia Mould. Derrell Lolling, M.D.DATE OF BIRTH:  Aug 07, 1921  DATE OF PROCEDURE:  01/18/2011 DATE OF DISCHARGE:                              OPERATIVE REPORT   PREOPERATIVE DIAGNOSIS:  Incarcerated right inguinal hernia.  POSTOPERATIVE DIAGNOSES: 1. Incarcerated right inguinal hernia. 2. Right hydrocele.  OPERATION PERFORMED: 1. Repair of incarcerated right inguinal hernia with mesh     (Lichtenstein type repair). 2. Right hydrocelectomy.  SURGEON:  Claud Kelp, MD  OPERATIVE INDICATIONS:  This is an 77 year old Caucasian gentleman who had a left inguinal hernia repair in 1953 and has had no problems with that.  He has a 5-year history of a right groin mass.  It has been getting larger.  He cannot reduce it.  He is on Coumadin because of atrial fibrillation.  He has had a normal colonoscopy.  On exam, he has a huge right inguinal and scrotal hernia that is soft, but it cannot be reduced.  A CT scan was performed, which showed small bowel and large bowel in the hernia.  There was also a 3.3 cm cystic mass in the body of the pancreas.  The patient was advised of this and was advised to have this evaluated by Gastroenterology at a later date.  He is brought to operating room electively.  OPERATIVE FINDINGS:  The patient had a huge indirect right inguinal hernia containing the colon and cecum and appendix, but these were irreducible and looked healthy.  There was minimal ascites.  Palpation within the abdominal cavity revealed no gross abnormalities.  The right iliac artery was soft and pulsatile.  There was no evidence of femoral hernia.  He had a large right hydrocele, which was separate from the inguinal hernia sac.  The testicle felt normal.  OPERATIVE TECHNIQUE:  Following induction of general  endotracheal anesthesia the patient's lower abdomen and genitalia were prepped and draped in a sterile fashion.  Intravenous antibiotics were given.  The patient was identified as to correct patient and correct procedure and correct site.  A transverse slightly oblique incision was made overlying the right inguinal canal extending down to beyond the level of the pubic tubercle.  Dissection was carried down through the subcutaneous tissue. There was a large hernia protruding through the external ring and extending down into the scrotum.  The external oblique was incised in the direction of its fibers, opening up the external inguinal ring.  The external oblique was then dissected away from the underlying tissues and self-retaining retractors were placed.  I had to spend a good deal of time separating the cord structures and hernia sac from the surrounding tissues, but once I was able to do this, I was able to encircle them with a Penrose drain.  I pulled the tissues up and found that he had a large hydrocele with the size of a large lemon.  This was opened and inspected and looked benign.  I ultimately trimmed the edges of this and then sewed it back on itself with a running locking suture of 2-0 Vicryl to prevent  re-formation.  I then spent a good deal of time dissecting the large indirect hernia sac away from the cord structures.  I then opened the sac and inspected the contents and found the cecum and the sac, which looked healthy and was reduced.  After dissecting the sac all the way back to the level of the internal ring, I closed the sac with a pursestring suture of 2-0 silk.  I excised the redundant sac and sent it to Pathology.  I oversewed the floor of the inguinal canal lateral to the internal ring with two figure-of-eight sutures of 2-0 Vicryl.  I injected 20 cc of Exparel local anesthetic into the muscle layers and the subcutaneous tissues.  The hernia was repaired with a  3-inch x 6-inch piece of Ultrapro mesh.  The mesh was trimmed at the corners to accommodate the wound.  The mesh was sutured in place with running sutures of 2-0 Prolene and interrupted mattress sutures of 2-0 Prolene.  The mesh was sutured so as to generously overlap the fascia at the pubic tubercle and then along the inguinal ligament inferiorly.  Medially, superiorly and superolaterally, several mattress sutures of 2-0 Prolene were placed. The mesh was incised laterally so as to wrap around the cord structures at the internal ring.  The tails of the mesh were overlapped laterally. Several sutures were placed to complete the repair.  This provided a very good repair and coverage both medial and lateral to the internal ring, but allowed a fingertip opening for the cord structures.  The wound was irrigated with saline.  The external oblique was closed with a running suture of 2-0 Vicryl.  The testicle was placed back into the scrotum.  The Scarpa's fascia was closed with 2-0 Vicryl sutures and the skin closed with running subcuticular suture of 4-0 Monocryl and Steri- Strips.  Clean bandages were placed and the patient taken to recovery room in stable condition.  Estimated blood loss was about 20 mL or less. Complications, none.  Sponge, needles counts were correct.     Angelia Mould. Derrell Lolling, M.D.     HMI/MEDQ  D:  01/18/2011  T:  01/19/2011  Job:  [id number]  cc:   Thomas C. Wall, MD, FACC [address]  [address] [place] [address]  Dr. Orlene Plum  Electronically Signed by Claud Kelp M.D. on 01/19/2011 11:22:59 AM

## 2011-01-20 ENCOUNTER — Telehealth (INDEPENDENT_AMBULATORY_CARE_PROVIDER_SITE_OTHER): Payer: Self-pay

## 2011-01-20 NOTE — Telephone Encounter (Signed)
Pt home doing well. Per wife pt had good breakfast. Po appt made for 01-27-11. To call with any concerns.

## 2011-01-27 ENCOUNTER — Encounter (INDEPENDENT_AMBULATORY_CARE_PROVIDER_SITE_OTHER): Payer: Medicare Other | Admitting: General Surgery

## 2011-02-03 ENCOUNTER — Ambulatory Visit (INDEPENDENT_AMBULATORY_CARE_PROVIDER_SITE_OTHER): Payer: Medicare Other | Admitting: General Surgery

## 2011-02-03 ENCOUNTER — Encounter (INDEPENDENT_AMBULATORY_CARE_PROVIDER_SITE_OTHER): Payer: Self-pay | Admitting: General Surgery

## 2011-02-03 ENCOUNTER — Encounter: Payer: Self-pay | Admitting: Gastroenterology

## 2011-02-03 VITALS — BP 138/68 | HR 90 | Temp 97.2°F

## 2011-02-03 DIAGNOSIS — K8689 Other specified diseases of pancreas: Secondary | ICD-10-CM

## 2011-02-03 DIAGNOSIS — K869 Disease of pancreas, unspecified: Secondary | ICD-10-CM

## 2011-02-03 NOTE — Progress Notes (Signed)
Subjective:     Patient ID: [name] [name], male   DOB: 15-Aug-1921, 77 y.o.   MRN: [id number]  HPI This 77 year old Caucasian man underwent open repair of incarcerated right inguinal hernia and right hydrocele on January 18, 2011. He is on Coumadin and followed by Dr. Valera Castle.  He has done well. His GI function is normal. His pain is minimal. He's had no bleeding.  We discussed the CT finding of the 3.3 cm cystic mass in the body of his pancreas today and we discussed this at length. We discussed whether to do nothing, follow it radiographically, or referral to gastroenterology.  Review of Systems     Objective:   Physical Exam Elderly gentleman in no distress. His wife and daughter are with him.  Abdomen is soft and nontender. The right groin scar is healing very nicely. There is minimal edema of the right groin wound. The right testicle is slightly swollen but this is not significant, considering the magnitude of the surgery. There is no hematoma or infection.    Assessment:     Complex, incarcerated right inguinal hernia and associated hydrocele, recovering uneventfully in the early postop period following open repair with mesh.  3.3 cm cystic mass of the pancreatic body, asymptomatic, found incidentally on CT scan. This may be a cystic neoplasm. More likely it is benign. He is not a good operative candidate for pancreatic surgery.    Plan:     Diet and activities are discussed. No heavy lifting for 3 more weeks.  He will be referred to Dr. Rob Bunting at [facility] for a consultation regarding his pancreatic cystic mass. Perhaps this should simply be followed radiographically. The other option would be endoscopic ultrasound and possible biopsy to clarify the diagnosis him.I do not think that he is a good candidate for surgery and I told him so.  He was [facility] his pancreatic cystic mass, but he declined that, stating he was  comfortable with our advice.  No return to see me p.r.n.

## 2011-02-03 NOTE — Patient Instructions (Signed)
You are recovering uneventfully from the right inguinal hernia surgery. I advised no heavy lifting for another 3 weeks. The swelling should resolve in 3 months. He will be referred to Dr. Rob Bunting of the [facility] department for evaluation of the cystic lesion on your pancreas.

## 2011-03-07 ENCOUNTER — Ambulatory Visit: Payer: Medicare Other | Admitting: Gastroenterology

## 2011-03-10 LAB — BASIC METABOLIC PANEL
BUN: 17
CO2: 25
Calcium: 8.3 — ABNORMAL LOW
Calcium: 8.4
Calcium: 8.4
Creatinine, Ser: 0.89
Creatinine, Ser: 0.93
GFR calc Af Amer: >60
GFR calc Af Amer: >60
GFR calc Af Amer: >60
GFR calc non Af Amer: >60
GFR calc non Af Amer: >60
GFR calc non Af Amer: >60
Glucose, Bld: 127 — ABNORMAL HIGH
Potassium: 4.9
Sodium: 136
Sodium: 137

## 2011-03-10 LAB — CBC
Hemoglobin: 10.6 — ABNORMAL LOW
MCV: 93.3
Platelets: 157
RBC: 3.38 — ABNORMAL LOW
RDW: 14.5
WBC: 11.9 — ABNORMAL HIGH
WBC: 16 — ABNORMAL HIGH

## 2011-03-10 LAB — HEMOGLOBIN AND HEMATOCRIT, BLOOD
HCT: 33 — ABNORMAL LOW
Hemoglobin: 11.4 — ABNORMAL LOW

## 2011-03-10 LAB — PROTIME-INR
INR: 1.1
INR: 1.1
INR: 1.2
Prothrombin Time: 14.8
Prothrombin Time: 15.8 — ABNORMAL HIGH
Prothrombin Time: 17.1 — ABNORMAL HIGH

## 2011-03-10 LAB — APTT
aPTT: 38 — ABNORMAL HIGH
aPTT: 39 — ABNORMAL HIGH

## 2011-12-26 ENCOUNTER — Encounter (HOSPITAL_COMMUNITY): Payer: Self-pay | Admitting: Pharmacy Technician

## 2011-12-26 NOTE — Patient Instructions (Signed)
Your procedure is scheduled on:  Monday, 01/02/12  Report to [facility] at  1000    AM.  Call this number if you have problems the morning of surgery: [phone]   Remember:   Do not eat or drink   After Midnight.  Take these medicines the morning of surgery with A SIP OF WATER: cardizem and benazepril and vicodin if needed   Do not wear jewelry, make-up or nail polish.  Do not wear lotions, powders, or perfumes. You may wear deodorant.  Do not bring valuables to the hospital.  Contacts, dentures or bridgework may not be worn into surgery.     Patients discharged the day of surgery will not be allowed to drive home.  Name and phone number of your driver: driver  Special Instructions: Use eye drops as directed.   Please read over the following fact sheets that you were given: Pain Booklet, Anesthesia Post-op Instructions and Care and Recovery After Surgery    Cataract Surgery  A cataract is a clouding of the lens of the eye. When a lens becomes cloudy, vision is reduced based on the degree and nature of the clouding. Surgery may be needed to improve vision. Surgery removes the cloudy lens and usually replaces it with a substitute lens (intraocular lens, IOL). LET YOUR EYE DOCTOR KNOW ABOUT:  Allergies to food or medicine.   Medicines taken including herbs, eyedrops, over-the-counter medicines, and creams.   Use of steroids (by mouth or creams).   Previous problems with anesthetics or numbing medicine.   History of bleeding problems or blood clots.   Previous surgery.   Other health problems, including diabetes and kidney problems.   Possibility of pregnancy, if this applies.  RISKS AND COMPLICATIONS  Infection.   Inflammation of the eyeball (endophthalmitis) that can spread to both eyes (sympathetic ophthalmia).   Poor wound healing.   If an IOL is inserted, it can later fall out of proper position. This is very uncommon.   Clouding of the part of your eye that holds an  IOL in place. This is called an "after-cataract." These are uncommon, but easily treated.  BEFORE THE PROCEDURE  Do not eat or drink anything except small amounts of water for 8 to 12 before your surgery, or as directed by your caregiver.   Unless you are told otherwise, continue any eyedrops you have been prescribed.   Talk to your primary caregiver about all other medicines that you take (both prescription and non-prescription). In some cases, you may need to stop or change medicines near the time of your surgery. This is most important if you are taking blood-thinning medicine.Do not stop medicines unless you are told to do so.   Arrange for someone to drive you to and from the procedure.   Do not put contact lenses in either eye on the day of your surgery.  PROCEDURE There is more than one method for safely removing a cataract. Your doctor can explain the differences and help determine which is best for you. Phacoemulsification surgery is the most common form of cataract surgery.  An injection is given behind the eye or eyedrops are given to make this a painless procedure.   A small cut (incision) is made on the edge of the clear, dome-shaped surface that covers the front of the eye (cornea).   A tiny probe is painlessly inserted into the eye. This device gives off ultrasound waves that soften and break up the cloudy center of the  lens. This makes it easier for the cloudy lens to be removed by suction.   An IOL may be implanted.   The normal lens of the eye is covered by a clear capsule. Part of that capsule is intentionally left in the eye to support the IOL.   Your surgeon may or may not use stitches to close the incision.  There are other forms of cataract surgery that require a larger incision and stiches to close the eye. This approach is taken in cases where the doctor feels that the cataract cannot be easily removed using phacoemulsification. AFTER THE PROCEDURE  When an  IOL is implanted, it does not need care. It becomes a permanent part of your eye and cannot be seen or felt.   Your doctor will schedule follow-up exams to check on your progress.   Review your other medicines with your doctor to see which can be resumed after surgery.   Use eyedrops or take medicine as prescribed by your doctor.  Document Released: 05/19/2011 Document Reviewed: 05/16/2011 [facility] Patient Information 2012 [place], [place].  PATIENT INSTRUCTIONS POST-ANESTHESIA  IMMEDIATELY FOLLOWING SURGERY:  Do not drive or operate machinery for the first twenty four hours after surgery.  Do not make any important decisions for twenty four hours after surgery or while taking narcotic pain medications or sedatives.  If you develop intractable nausea and vomiting or a severe headache please notify your doctor immediately.  FOLLOW-UP:  Please make an appointment with your surgeon as instructed. You do not need to follow up with anesthesia unless specifically instructed to do so.  WOUND CARE INSTRUCTIONS (if applicable):  Keep a dry clean dressing on the anesthesia/puncture wound site if there is drainage.  Once the wound has quit draining you may leave it open to air.  Generally you should leave the bandage intact for twenty four hours unless there is drainage.  If the epidural site drains for more than 36-48 hours please call the anesthesia department.  QUESTIONS?:  Please feel free to call your physician or the hospital operator if you have any questions, and they will be happy to assist you.

## 2011-12-27 ENCOUNTER — Encounter (HOSPITAL_COMMUNITY)
Admission: RE | Admit: 2011-12-27 | Discharge: 2011-12-27 | Disposition: A | Discharge status: Home / Self Care | Payer: Medicare Other | Source: Ambulatory Visit | Attending: Ophthalmology | Admitting: Ophthalmology

## 2011-12-27 ENCOUNTER — Encounter (HOSPITAL_COMMUNITY): Payer: Self-pay

## 2011-12-27 ENCOUNTER — Ambulatory Visit: Payer: Medicare Other | Admitting: Cardiology

## 2011-12-27 HISTORY — DX: Localized edema: R60.0

## 2011-12-27 HISTORY — DX: Edema, unspecified: R60.9

## 2011-12-27 LAB — BASIC METABOLIC PANEL
BUN: 25 mg/dL — ABNORMAL HIGH (ref 6–23)
Calcium: 9.2 mg/dL (ref 8.4–10.5)
GFR calc Af Amer: 72 mL/min — ABNORMAL LOW (ref >90)
GFR calc non Af Amer: 62 mL/min — ABNORMAL LOW (ref >90)
Glucose, Bld: 104 mg/dL — ABNORMAL HIGH (ref 70–99)
Potassium: 3.8 mEq/L (ref 3.5–5.1)

## 2011-12-29 ENCOUNTER — Encounter: Payer: Self-pay | Admitting: *Deleted

## 2011-12-30 MED ORDER — LIDOCAINE HCL (PF) 1 % IJ SOLN
INTRAMUSCULAR | Status: AC
  Filled 2011-12-30: qty 2

## 2011-12-30 MED ORDER — CYCLOPENTOLATE-PHENYLEPHRINE 0.2-1 % OP SOLN
OPHTHALMIC | Status: AC
  Filled 2011-12-30: qty 2

## 2011-12-30 MED ORDER — LIDOCAINE HCL 3.5 % OP GEL
OPHTHALMIC | Status: AC
  Filled 2011-12-30: qty 5

## 2011-12-30 MED ORDER — TETRACAINE HCL 0.5 % OP SOLN
OPHTHALMIC | Status: AC
  Filled 2011-12-30: qty 2

## 2011-12-30 MED ORDER — PHENYLEPHRINE HCL 2.5 % OP SOLN
OPHTHALMIC | Status: AC
  Filled 2011-12-30: qty 2

## 2011-12-30 MED ORDER — NEOMYCIN-POLYMYXIN-DEXAMETH 3.5-10000-0.1 OP OINT
TOPICAL_OINTMENT | OPHTHALMIC | Status: AC
  Filled 2011-12-30: qty 3.5

## 2012-01-02 ENCOUNTER — Encounter (HOSPITAL_COMMUNITY): Payer: Self-pay | Admitting: *Deleted

## 2012-01-02 ENCOUNTER — Encounter (HOSPITAL_COMMUNITY): Payer: Self-pay | Admitting: Ophthalmology

## 2012-01-02 ENCOUNTER — Encounter (HOSPITAL_COMMUNITY)
Admission: RE | Disposition: A | Discharge status: Home / Self Care | Payer: Self-pay | Source: Ambulatory Visit | Attending: Ophthalmology

## 2012-01-02 ENCOUNTER — Ambulatory Visit (HOSPITAL_COMMUNITY)
Admission: RE | Admit: 2012-01-02 | Discharge: 2012-01-02 | Disposition: A | Discharge status: Home / Self Care | Payer: Medicare Other | Source: Ambulatory Visit | Attending: Ophthalmology | Admitting: Ophthalmology

## 2012-01-02 ENCOUNTER — Encounter (HOSPITAL_COMMUNITY): Payer: Self-pay | Admitting: Anesthesiology

## 2012-01-02 ENCOUNTER — Ambulatory Visit (HOSPITAL_COMMUNITY): Payer: Medicare Other | Admitting: Anesthesiology

## 2012-01-02 DIAGNOSIS — Z01810 Encounter for preprocedural cardiovascular examination: Secondary | ICD-10-CM | POA: Insufficient documentation

## 2012-01-02 DIAGNOSIS — H2589 Other age-related cataract: Secondary | ICD-10-CM | POA: Insufficient documentation

## 2012-01-02 DIAGNOSIS — I10 Essential (primary) hypertension: Secondary | ICD-10-CM | POA: Insufficient documentation

## 2012-01-02 DIAGNOSIS — Z79899 Other long term (current) drug therapy: Secondary | ICD-10-CM | POA: Insufficient documentation

## 2012-01-02 DIAGNOSIS — Z01812 Encounter for preprocedural laboratory examination: Secondary | ICD-10-CM | POA: Insufficient documentation

## 2012-01-02 HISTORY — PX: CATARACT EXTRACTION W/PHACO: SHX586

## 2012-01-02 SURGERY — PHACOEMULSIFICATION, CATARACT, WITH IOL INSERTION
Anesthesia: Monitor Anesthesia Care | Site: Eye | Laterality: Left | Wound class: Clean

## 2012-01-02 MED ORDER — TETRACAINE HCL 0.5 % OP SOLN
1.0000 [drp] | OPHTHALMIC | Status: AC
  Administered 2012-01-02 (×3): 1 [drp] via OPHTHALMIC

## 2012-01-02 MED ORDER — MIDAZOLAM HCL 2 MG/2ML IJ SOLN
1.0000 mg | INTRAMUSCULAR | Status: DC | PRN
  Administered 2012-01-02: 2 mg via INTRAVENOUS

## 2012-01-02 MED ORDER — POVIDONE-IODINE 5 % OP SOLN
OPHTHALMIC | Status: DC | PRN
  Administered 2012-01-02: 1 via OPHTHALMIC

## 2012-01-02 MED ORDER — EPINEPHRINE HCL 1 MG/ML IJ SOLN
INTRAOCULAR | Status: DC | PRN
  Administered 2012-01-02: 11:00:00

## 2012-01-02 MED ORDER — PROVISC 10 MG/ML IO SOLN
INTRAOCULAR | Status: DC | PRN
  Administered 2012-01-02: 8.5 mg via INTRAOCULAR

## 2012-01-02 MED ORDER — LIDOCAINE 3.5 % OP GEL OPTIME - NO CHARGE
OPHTHALMIC | Status: DC | PRN
  Administered 2012-01-02: 2 [drp] via OPHTHALMIC

## 2012-01-02 MED ORDER — MIDAZOLAM HCL 2 MG/2ML IJ SOLN
INTRAMUSCULAR | Status: AC
  Filled 2012-01-02: qty 2

## 2012-01-02 MED ORDER — LIDOCAINE HCL 3.5 % OP GEL
1.0000 "application " | Freq: Once | OPHTHALMIC | Status: AC
  Administered 2012-01-02: 1 via OPHTHALMIC

## 2012-01-02 MED ORDER — NEOMYCIN-POLYMYXIN-DEXAMETH 0.1 % OP OINT
TOPICAL_OINTMENT | OPHTHALMIC | Status: DC | PRN
  Administered 2012-01-02: 1 via OPHTHALMIC

## 2012-01-02 MED ORDER — CYCLOPENTOLATE-PHENYLEPHRINE 0.2-1 % OP SOLN
1.0000 [drp] | OPHTHALMIC | Status: AC
  Administered 2012-01-02 (×3): 1 [drp] via OPHTHALMIC

## 2012-01-02 MED ORDER — PHENYLEPHRINE HCL 2.5 % OP SOLN
1.0000 [drp] | OPHTHALMIC | Status: AC
  Administered 2012-01-02 (×3): 1 [drp] via OPHTHALMIC

## 2012-01-02 MED ORDER — LACTATED RINGERS IV SOLN
INTRAVENOUS | Status: DC
  Administered 2012-01-02: 1000 mL via INTRAVENOUS

## 2012-01-02 MED ORDER — LACTATED RINGERS IV SOLN
INTRAVENOUS | Status: DC | PRN
  Administered 2012-01-02: 10:00:00 via INTRAVENOUS

## 2012-01-02 MED ORDER — BSS IO SOLN
INTRAOCULAR | Status: DC | PRN
  Administered 2012-01-02: 15 mL via INTRAOCULAR

## 2012-01-02 MED ORDER — LIDOCAINE HCL (PF) 1 % IJ SOLN
INTRAMUSCULAR | Status: DC | PRN
  Administered 2012-01-02: .3 mL

## 2012-01-02 SURGICAL SUPPLY — 32 items
CAPSULAR TENSION RING-AMO (OPHTHALMIC RELATED) IMPLANT
CLOTH BEACON ORANGE TIMEOUT ST (SAFETY) ×1 IMPLANT
EYE SHIELD UNIVERSAL CLEAR (GAUZE/BANDAGES/DRESSINGS) ×1 IMPLANT
GLOVE BIO SURGEON STRL SZ 6.5 (GLOVE) IMPLANT
GLOVE BIOGEL PI IND STRL 6.5 (GLOVE) IMPLANT
GLOVE BIOGEL PI IND STRL 7.0 (GLOVE) IMPLANT
GLOVE BIOGEL PI IND STRL 7.5 (GLOVE) IMPLANT
GLOVE BIOGEL PI INDICATOR 6.5 (GLOVE) ×1
GLOVE BIOGEL PI INDICATOR 7.0 (GLOVE)
GLOVE BIOGEL PI INDICATOR 7.5 (GLOVE)
GLOVE ECLIPSE 6.5 STRL STRAW (GLOVE) IMPLANT
GLOVE ECLIPSE 7.0 STRL STRAW (GLOVE) IMPLANT
GLOVE ECLIPSE 7.5 STRL STRAW (GLOVE) IMPLANT
GLOVE EXAM NITRILE LRG STRL (GLOVE) IMPLANT
GLOVE EXAM NITRILE MD LF STRL (GLOVE) ×2 IMPLANT
GLOVE SKINSENSE NS SZ6.5 (GLOVE)
GLOVE SKINSENSE NS SZ7.0 (GLOVE)
GLOVE SKINSENSE STRL SZ6.5 (GLOVE) IMPLANT
GLOVE SKINSENSE STRL SZ7.0 (GLOVE) IMPLANT
KIT VITRECTOMY (OPHTHALMIC RELATED) IMPLANT
PAD ARMBOARD 7.5X6 YLW CONV (MISCELLANEOUS) ×2 IMPLANT
PROC W NO LENS (INTRAOCULAR LENS)
PROC W SPEC LENS (INTRAOCULAR LENS)
PROCESS W NO LENS (INTRAOCULAR LENS) IMPLANT
PROCESS W SPEC LENS (INTRAOCULAR LENS) IMPLANT
RING MALYGIN (MISCELLANEOUS) IMPLANT
SIGHTPATH CAT PROC W REG LENS (Ophthalmic Related) ×2 IMPLANT
SYR TB 1ML LL NO SAFETY (SYRINGE) ×1 IMPLANT
TAPE SURG TRANSPORE 1 IN (GAUZE/BANDAGES/DRESSINGS) ×1 IMPLANT
TAPE SURGICAL TRANSPORE 1 IN (GAUZE/BANDAGES/DRESSINGS) ×1
VISCOELASTIC ADDITIONAL (OPHTHALMIC RELATED) IMPLANT
WATER STERILE IRR 250ML POUR (IV SOLUTION) ×1 IMPLANT

## 2012-01-02 NOTE — Anesthesia Preprocedure Evaluation (Signed)
Anesthesia Evaluation  Patient identified by MRN, date of birth, ID band Patient awake    Reviewed: Allergy & Precautions, H&P , NPO status , Patient's Chart, lab work & pertinent test results  Airway Mallampati: II      Dental  (+) Teeth Intact and Poor Dentition   Pulmonary  breath sounds clear to auscultation        Cardiovascular hypertension, Pt. on medications + dysrhythmias Atrial Fibrillation Rhythm:Irregular Rate:Normal     Neuro/Psych    GI/Hepatic   Endo/Other    Renal/GU      Musculoskeletal   Abdominal   Peds  Hematology   Anesthesia Other Findings   Reproductive/Obstetrics                           Anesthesia Physical Anesthesia Plan  ASA: III  Anesthesia Plan: MAC   Post-op Pain Management:    Induction: Intravenous  Airway Management Planned: Nasal Cannula  Additional Equipment:   Intra-op Plan:   Post-operative Plan:   Informed Consent: I have reviewed the patients History and Physical, chart, labs and discussed the procedure including the risks, benefits and alternatives for the proposed anesthesia with the patient or authorized representative who has indicated his/her understanding and acceptance.     Plan Discussed with:   Anesthesia Plan Comments:         Anesthesia Quick Evaluation

## 2012-01-02 NOTE — Op Note (Signed)
NAME:  [name] [name], [name] [name]             ACCOUNT NO.:  [id number]  MEDICAL RECORD NO.:  [id number]  LOCATION:  APPO                          FACILITY:  [facility]  PHYSICIAN:  Susanne Greenhouse, MD       DATE OF BIRTH:  [birth year]  DATE OF PROCEDURE:  01/02/2012 DATE OF DISCHARGE:  01/02/2012                              OPERATIVE REPORT   PREOPERATIVE DIAGNOSIS:  Combined cataract, left eye.  POSTOPERATIVE DIAGNOSIS:  Combined cataract, left eye.  DIAGNOSIS CODE:  366.19.  OPERATION PERFORMED:  Phacoemulsification with posterior chamber intraocular lens implantation, left eye.  SURGEON:  Bonne Dolores. Beverlee Wilmarth, MD.  OPERATIVE SUMMARY:  In the preoperative area, dilating drops were placed into the left eye.  The patient was then brought into the operating room where he was placed under general anesthesia.  The eye was then prepped and draped.  Beginning with a 75 blade, a paracentesis port was made at the surgeon's 2 o'clock position.  The anterior chamber was then filled with a 1% nonpreserved lidocaine solution with epinephrine.  This was followed by Viscoat to deepen the chamber.  A small fornix-based peritomy was performed superiorly.  Next, a single iris hook was placed through the limbus superiorly.  A 2.4-mm keratome blade was then used to make a clear corneal incision over the iris hook.  A bent cystotome needle and Utrata forceps were used to create a continuous tear capsulotomy.  Hydrodissection was performed using balanced salt solution on a fine cannula.  The lens nucleus was then removed using phacoemulsification in a quadrant cracking technique.  The cortical material was then removed with irrigation and aspiration.  The capsular bag and anterior chamber were refilled with Provisc.  The wound was widened to approximately 3 mm and a posterior chamber intraocular lens was placed into the capsular bag without difficulty using an [employer] lens injecting system.  A single 10-0 nylon  suture was then used to close the incision as well as stromal hydration.  The Provisc was removed from the anterior chamber and capsular bag with irrigation and aspiration.  At this point, the wounds were tested for leak, which were negative.  The anterior chamber remained deep and stable.  The patient tolerated the procedure well.  There were no operative complications, and he awoke from general anesthesia without problem.  No surgical specimens.  Prosthetic device used is a Lenstec posterior chamber lens, model Softec HD, power of 22.5, serial number is [id number]. __________          ______________________________ Susanne Greenhouse, MD     KEH/MEDQ  D:  01/02/2012  T:  01/02/2012  Job:  [id number]

## 2012-01-02 NOTE — Preoperative (Signed)
Beta Blockers   Reason not to administer Beta Blockers:Not Applicable 

## 2012-01-02 NOTE — Transfer of Care (Signed)
Immediate Anesthesia Transfer of Care Note  Patient: [name] [name]  Procedure(s) Performed: Procedure(s) (LRB): CATARACT EXTRACTION PHACO AND INTRAOCULAR LENS PLACEMENT (IOC) (Left)  Patient Location: PACU  Anesthesia Type: MAC  Level of Consciousness: awake, alert , oriented and patient cooperative  Airway & Oxygen Therapy: Patient Spontanous Breathing  Post-op Assessment: Report given to PACU RN and Post -op Vital signs reviewed and stable  Post vital signs: Reviewed and stable  Complications: No apparent anesthesia complications

## 2012-01-02 NOTE — Anesthesia Postprocedure Evaluation (Signed)
  Anesthesia Post-op Note  Patient: [name] [name]  Procedure(s) Performed: Procedure(s) (LRB): CATARACT EXTRACTION PHACO AND INTRAOCULAR LENS PLACEMENT (IOC) (Left)  Patient Location: PACU  Anesthesia Type: MAC  Level of Consciousness: awake, alert , oriented and patient cooperative  Airway and Oxygen Therapy: Patient Spontanous Breathing  Post-op Pain: none  Post-op Assessment: Post-op Vital signs reviewed, Patient's Cardiovascular Status Stable, Respiratory Function Stable and Patent Airway  Post-op Vital Signs: Reviewed and stable  Complications: No apparent anesthesia complications

## 2012-01-02 NOTE — Anesthesia Procedure Notes (Signed)
Procedure Name: MAC Date/Time: 01/02/2012 11:09 AM Performed by: Carolyne Littles, Lukus Binion L Pre-anesthesia Checklist: Patient identified, Patient being monitored, Emergency Drugs available, Timeout performed and Suction available Patient Re-evaluated:Patient Re-evaluated prior to inductionOxygen Delivery Method: Nasal cannula

## 2012-01-02 NOTE — H&P (Signed)
I have reviewed the H&P, the patient was re-examined, and I have identified no interval changes in medical condition and plan of care since the history and physical of record  

## 2012-01-02 NOTE — Brief Op Note (Signed)
Pre-Op Dx: Cataract OS Post-Op Dx: Cataract OS Surgeon: Rayola Everhart Anesthesia: Topical with MAC Surgery: Cataract Extraction with Intraocular lens Implant OS Implant: Lenstec, Model Softec HD Specimen: None Complications: None 

## 2012-01-05 ENCOUNTER — Encounter (HOSPITAL_COMMUNITY): Payer: Self-pay | Admitting: Ophthalmology

## 2012-01-17 ENCOUNTER — Ambulatory Visit (INDEPENDENT_AMBULATORY_CARE_PROVIDER_SITE_OTHER): Payer: Medicare Other | Admitting: Cardiology

## 2012-01-17 ENCOUNTER — Encounter: Payer: Self-pay | Admitting: Cardiology

## 2012-01-17 VITALS — BP 142/70 | HR 64 | Ht 69.0 in | Wt 145.0 lb

## 2012-01-17 DIAGNOSIS — R600 Localized edema: Secondary | ICD-10-CM | POA: Insufficient documentation

## 2012-01-17 DIAGNOSIS — R609 Edema, unspecified: Secondary | ICD-10-CM

## 2012-01-17 DIAGNOSIS — I10 Essential (primary) hypertension: Secondary | ICD-10-CM

## 2012-01-17 DIAGNOSIS — I4891 Unspecified atrial fibrillation: Secondary | ICD-10-CM

## 2012-01-17 DIAGNOSIS — I6529 Occlusion and stenosis of unspecified carotid artery: Secondary | ICD-10-CM

## 2012-01-17 MED ORDER — FUROSEMIDE 20 MG PO TABS: 20.0000 mg | ORAL_TABLET | Freq: Every day | ORAL | Status: AC

## 2012-01-17 NOTE — Progress Notes (Signed)
HPI Mr. Gaccione returns for evaluation and management of his chronic A. fib, history of atrial flutter, long-term use of anticoagulants.  His biggest complaint today is that of swelling in his ankles. He denies orthopnea, PND. He is plagued by arthritis and has a hard time getting around. He denies any palpitations, chest pain and no melena. He is very compliant with his medications.  Past Medical History  Diagnosis Date  . Atrial fibrillation   . Encounter for long-term (current) use of anticoagulants     Coumadin  . Occlusion and stenosis of carotid artery without mention of cerebral infarction   . Unspecified essential hypertension   . Elevated prostate specific antigen (PSA)   . Polymyalgia rheumatica   . Macular degeneration   . Epistaxis   . Carcinomas, basal cell   . Lower extremity weakness   . Lacunar stroke     Histiry of small right MCA Lacunar Stroke  . Peripheral edema     Current Outpatient Prescriptions  Medication Sig Dispense Refill  . benazepril (LOTENSIN) 10 MG tablet Take 10 mg by mouth daily.        . calcium-vitamin D (OSCAL WITH D) 500-200 MG-UNIT per tablet Take 1 tablet by mouth 2 (two) times daily.        Marland Kitchen diltiazem (CARDIZEM CD) 240 MG 24 hr capsule Take 240 mg by mouth daily.        Marland Kitchen HYDROcodone-acetaminophen (VICODIN) 5-500 MG per tablet Take 1 tablet by mouth every 6 (six) hours as needed. For pain      . Multiple Vitamins-Minerals (MULTIVITAMIN WITH MINERALS) tablet Take 1 tablet by mouth daily.        . polyvinyl alcohol-povidone (REFRESH) 1.4-0.6 % ophthalmic solution Place 1-2 drops into both eyes every 4 (four) hours.      Marland Kitchen warfarin (COUMADIN) 2 MG tablet Take 2 mg by mouth as directed. Takes on Monday, Wednesday, & Friday      . warfarin (COUMADIN) 4 MG tablet Take 4 mg by mouth as directed. Takes on Sunday, Tuesday, Thursday, & Saturday        Allergies  Allergen Reactions  . Penicillins Swelling    Face only.    Family History    Problem Relation Age of Onset  . Stroke Mother 63    History   Social History  . Marital Status: Married    Spouse Name: N/A    Number of Children: N/A  . Years of Education: N/A   Occupational History  . 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 Smoking status: Never Smoker   . Smokeless tobacco: Not on file  . Alcohol Use: Yes  . Drug Use: No  . Sexually Active: Not on file   Other Topics Concern  . 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    ROS ALL NEGATIVE EXCEPT THOSE NOTED IN HPI  PE  General Appearance: well developed, well nourished in no acute distress HEENT: symmetrical face, PERRLA, good dentition  Neck: no JVD, thyromegaly, or adenopathy, trachea midline Chest: symmetric without deformity Cardiac: PMI non-displaced, irregular rate and rhythm normal S1, S2, no gallop or murmur Lung: clear to ausculation and percussion Vascular: all pulses full without bruits  Abdominal: nondistended, nontender, good bowel sounds, no HSM, no bruits Extremities: no cyanosis, clubbing, 2+ pedal edema bilaterally, no sign  of DVT, no varicosities  Skin: normal color, no rashes Neuro: alert and oriented x 3, non-focal Pysch: normal affect  EKG Chronic A. fib, well-controlled ventricular rate, PVC BMET    Component Value Date/Time   NA 134* 12/27/2011 1430   K 3.8 12/27/2011 1430   CL 102 12/27/2011 1430   CO2 23 12/27/2011 1430   GLUCOSE 104* 12/27/2011 1430   BUN 25* 12/27/2011 1430   CREATININE 1.04 12/27/2011 1430   CALCIUM 9.2 12/27/2011 1430   GFRNONAA 62* 12/27/2011 1430   GFRAA 72* 12/27/2011 1430    Lipid Panel  No results found for this basename: chol, trig, hdl, cholhdl, vldl, ldlcalc    CBC    Component Value Date/Time   WBC 7.6 01/12/2011 1350   RBC 3.62* 01/12/2011 1350   HGB 11.5* 12/27/2011 1430   HCT 35.1* 12/27/2011 1430   PLT 197 01/12/2011 1350    MCV 92.3 01/12/2011 1350   MCH 29.8 01/12/2011 1350   MCHC 32.3 01/12/2011 1350   RDW 14.8 01/12/2011 1350   LYMPHSABS PENDING 01/12/2011 1350   LYMPHSABS 1.2 01/12/2011 1350   MONOABS PENDING 01/12/2011 1350   MONOABS 0.5 01/12/2011 1350   EOSABS PENDING 01/12/2011 1350   EOSABS 0.2 01/12/2011 1350   BASOSABS PENDING 01/12/2011 1350   BASOSABS 0.0 01/12/2011 1350

## 2012-01-17 NOTE — Assessment & Plan Note (Signed)
Begin Lasix 20 mg each morning. Potassium rich diet. Followup electrolytes in 2 weeks. Recent electrolytes and renal function were okay.

## 2012-01-17 NOTE — Assessment & Plan Note (Signed)
Stable. Continue current medical therapy. Return the office in one year. 

## 2012-01-17 NOTE — Assessment & Plan Note (Signed)
Nonobstructive by history. I do not have any bruits. We'll not repeat Dopplers.

## 2012-01-17 NOTE — Patient Instructions (Addendum)
Your physician has recommended you make the following change in your medication: Start: furosemide (lasix) 1 tablet each morning.   Your physician recommends that you return for lab work in: 2 weeks for a BMP.  You do not have to be fasting.  Your physician recommends that you schedule a follow-up appointment in: 1 year with Dr. Daleen Squibb.    Remember to follow a diet rich in potassium and low in sodium  Foods Rich in Potassium  Food / Potassium (mg)  Apricots, dried,  cup / 378 mg   Apricots, raw, 1 cup halves / 401 mg   Avocado,  / 487 mg   Banana, 1 large / 487 mg   Beef, lean, round, 3 oz / 202 mg   Cantaloupe, 1 cup cubes / 427 mg   Dates, medjool, 5 whole / 835 mg   Ham, cured, 3 oz / 212 mg   Lentils, dried,  cup / 458 mg   Lima beans, frozen,  cup / 258 mg   Orange, 1 large / 333 mg   Orange juice, 1 cup / 443 mg   Peaches, dried,  cup / 398 mg   Peas, split, cooked,  cup / 355 mg   Potato, boiled, 1 medium / 515 mg   Prunes, dried, uncooked,  cup / 318 mg   Raisins,  cup / 309 mg   Salmon, pink, raw, 3 oz / 275 mg   Sardines, canned , 3 oz / 338 mg   Tomato, raw, 1 medium / 292 mg   Tomato juice, 6 oz / 417 mg   [place], 3 oz / 349 mg

## 2012-02-01 ENCOUNTER — Other Ambulatory Visit (INDEPENDENT_AMBULATORY_CARE_PROVIDER_SITE_OTHER): Payer: Medicare Other

## 2012-02-01 DIAGNOSIS — R609 Edema, unspecified: Secondary | ICD-10-CM

## 2012-02-01 DIAGNOSIS — R600 Localized edema: Secondary | ICD-10-CM

## 2012-02-01 LAB — BASIC METABOLIC PANEL
BUN: 28 mg/dL — ABNORMAL HIGH (ref 6–23)
CO2: 27 mEq/L (ref 19–32)
Glucose, Bld: 78 mg/dL (ref 70–99)
Potassium: 4.4 mEq/L (ref 3.5–5.1)
Sodium: 139 mEq/L (ref 135–145)

## 2012-06-27 ENCOUNTER — Emergency Department (HOSPITAL_COMMUNITY): Payer: Medicare Other

## 2012-06-27 ENCOUNTER — Encounter (HOSPITAL_COMMUNITY): Payer: Self-pay | Admitting: Emergency Medicine

## 2012-06-27 ENCOUNTER — Inpatient Hospital Stay (HOSPITAL_COMMUNITY)
Admission: EM | Admit: 2012-06-27 | Discharge: 2012-07-02 | DRG: 083 | Disposition: A | Discharge status: Home / Self Care | Payer: Medicare Other | Attending: General Surgery | Admitting: General Surgery

## 2012-06-27 DIAGNOSIS — S42001A Fracture of unspecified part of right clavicle, initial encounter for closed fracture: Secondary | ICD-10-CM | POA: Diagnosis present

## 2012-06-27 DIAGNOSIS — Z7901 Long term (current) use of anticoagulants: Secondary | ICD-10-CM

## 2012-06-27 DIAGNOSIS — I609 Nontraumatic subarachnoid hemorrhage, unspecified: Secondary | ICD-10-CM

## 2012-06-27 DIAGNOSIS — S42009A Fracture of unspecified part of unspecified clavicle, initial encounter for closed fracture: Secondary | ICD-10-CM | POA: Diagnosis present

## 2012-06-27 DIAGNOSIS — H3530 Unspecified macular degeneration: Secondary | ICD-10-CM | POA: Diagnosis present

## 2012-06-27 DIAGNOSIS — I4891 Unspecified atrial fibrillation: Secondary | ICD-10-CM | POA: Diagnosis present

## 2012-06-27 DIAGNOSIS — W19XXXA Unspecified fall, initial encounter: Secondary | ICD-10-CM | POA: Diagnosis present

## 2012-06-27 DIAGNOSIS — S066X9A Traumatic subarachnoid hemorrhage with loss of consciousness of unspecified duration, initial encounter: Secondary | ICD-10-CM

## 2012-06-27 DIAGNOSIS — W108XXA Fall (on) (from) other stairs and steps, initial encounter: Secondary | ICD-10-CM | POA: Diagnosis present

## 2012-06-27 DIAGNOSIS — M353 Polymyalgia rheumatica: Secondary | ICD-10-CM | POA: Diagnosis present

## 2012-06-27 DIAGNOSIS — S066XAA Traumatic subarachnoid hemorrhage with loss of consciousness status unknown, initial encounter: Secondary | ICD-10-CM | POA: Diagnosis present

## 2012-06-27 DIAGNOSIS — Z8673 Personal history of transient ischemic attack (TIA), and cerebral infarction without residual deficits: Secondary | ICD-10-CM

## 2012-06-27 DIAGNOSIS — S12100A Unspecified displaced fracture of second cervical vertebra, initial encounter for closed fracture: Secondary | ICD-10-CM | POA: Diagnosis present

## 2012-06-27 DIAGNOSIS — S129XXA Fracture of neck, unspecified, initial encounter: Secondary | ICD-10-CM

## 2012-06-27 LAB — CBC WITH DIFFERENTIAL/PLATELET
Basophils Relative: 0 % (ref 0–1)
Eosinophils Absolute: 0.1 10*3/uL (ref 0.0–0.7)
Eosinophils Relative: 1 % (ref 0–5)
HCT: 38.2 % — ABNORMAL LOW (ref 39.0–52.0)
Hemoglobin: 12.6 g/dL — ABNORMAL LOW (ref 13.0–17.0)
MCH: 31.5 pg (ref 26.0–34.0)
MCHC: 33 g/dL (ref 30.0–36.0)
MCV: 95.5 fL (ref 78.0–100.0)
Monocytes Absolute: 0.7 10*3/uL (ref 0.1–1.0)
Monocytes Relative: 5 % (ref 3–12)

## 2012-06-27 LAB — BASIC METABOLIC PANEL
BUN: 29 mg/dL — ABNORMAL HIGH (ref 6–23)
Calcium: 9.1 mg/dL (ref 8.4–10.5)
Chloride: 103 mEq/L (ref 96–112)
Creatinine, Ser: 1.18 mg/dL (ref 0.50–1.35)
GFR calc Af Amer: 62 mL/min — ABNORMAL LOW (ref >90)
GFR calc non Af Amer: 53 mL/min — ABNORMAL LOW (ref >90)

## 2012-06-27 LAB — URINALYSIS, ROUTINE W REFLEX MICROSCOPIC
Bilirubin Urine: NEGATIVE
Ketones, ur: NEGATIVE mg/dL
Nitrite: NEGATIVE
Protein, ur: NEGATIVE mg/dL
pH: 5.5 (ref 5.0–8.0)

## 2012-06-27 LAB — PROTIME-INR: INR: 1.38 (ref 0.00–1.49)

## 2012-06-27 MED ORDER — MORPHINE SULFATE 2 MG/ML IJ SOLN
2.0000 mg | INTRAMUSCULAR | Status: DC | PRN
  Administered 2012-06-27 – 2012-07-01 (×9): 2 mg via INTRAVENOUS
  Filled 2012-06-27 (×9): qty 1

## 2012-06-27 MED ORDER — DILTIAZEM HCL ER COATED BEADS 240 MG PO CP24
240.0000 mg | ORAL_CAPSULE | Freq: Every morning | ORAL | Status: DC
  Administered 2012-06-28 – 2012-07-02 (×5): 240 mg via ORAL
  Filled 2012-06-27 (×5): qty 1

## 2012-06-27 MED ORDER — BENAZEPRIL HCL 10 MG PO TABS
10.0000 mg | ORAL_TABLET | Freq: Every morning | ORAL | Status: DC
  Administered 2012-06-28 – 2012-07-02 (×5): 10 mg via ORAL
  Filled 2012-06-27 (×5): qty 1

## 2012-06-27 MED ORDER — MORPHINE SULFATE 4 MG/ML IJ SOLN
4.0000 mg | Freq: Once | INTRAMUSCULAR | Status: AC
  Administered 2012-06-27: 4 mg via INTRAVENOUS
  Filled 2012-06-27: qty 1

## 2012-06-27 MED ORDER — FUROSEMIDE 20 MG PO TABS
20.0000 mg | ORAL_TABLET | Freq: Every day | ORAL | Status: DC
  Administered 2012-06-27 – 2012-07-02 (×6): 20 mg via ORAL
  Filled 2012-06-27 (×6): qty 1

## 2012-06-27 MED ORDER — MORPHINE SULFATE 2 MG/ML IJ SOLN: 1.0000 mg | INTRAMUSCULAR | Status: DC | PRN

## 2012-06-27 MED ORDER — PANTOPRAZOLE SODIUM 40 MG PO TBEC
40.0000 mg | DELAYED_RELEASE_TABLET | Freq: Every day | ORAL | Status: DC
  Administered 2012-06-27 – 2012-07-01 (×3): 40 mg via ORAL
  Filled 2012-06-27 (×3): qty 1

## 2012-06-27 MED ORDER — POLYVINYL ALCOHOL-POVIDONE 1.4-0.6 % OP SOLN: 1.0000 [drp] | OPHTHALMIC | Status: DC

## 2012-06-27 MED ORDER — PANTOPRAZOLE SODIUM 40 MG IV SOLR
40.0000 mg | Freq: Every day | INTRAVENOUS | Status: DC
  Administered 2012-06-29 – 2012-06-30 (×2): 40 mg via INTRAVENOUS
  Filled 2012-06-27 (×3): qty 40

## 2012-06-27 MED ORDER — POLYVINYL ALCOHOL 1.4 % OP SOLN
1.0000 [drp] | OPHTHALMIC | Status: DC
  Administered 2012-06-28 (×4): 1 [drp] via OPHTHALMIC
  Administered 2012-06-29: 2 [drp] via OPHTHALMIC
  Administered 2012-06-29 (×2): 1 [drp] via OPHTHALMIC
  Administered 2012-06-29: 2 [drp] via OPHTHALMIC
  Administered 2012-06-29: 1 [drp] via OPHTHALMIC
  Administered 2012-06-30 (×4): 2 [drp] via OPHTHALMIC
  Administered 2012-06-30: 1 [drp] via OPHTHALMIC
  Administered 2012-06-30 – 2012-07-01 (×3): 2 [drp] via OPHTHALMIC
  Administered 2012-07-01 (×2): 1 [drp] via OPHTHALMIC
  Administered 2012-07-01: 2 [drp] via OPHTHALMIC
  Administered 2012-07-01 – 2012-07-02 (×2): 1 [drp] via OPHTHALMIC
  Administered 2012-07-02: 2 [drp] via OPHTHALMIC
  Filled 2012-06-27 (×2): qty 15

## 2012-06-27 MED ORDER — KCL IN DEXTROSE-NACL 20-5-0.45 MEQ/L-%-% IV SOLN
INTRAVENOUS | Status: DC
  Administered 2012-06-27: 75 mL via INTRAVENOUS
  Administered 2012-06-28: 14:00:00 via INTRAVENOUS
  Filled 2012-06-27 (×3): qty 1000

## 2012-06-27 MED ORDER — ONDANSETRON HCL 4 MG/2ML IJ SOLN
4.0000 mg | Freq: Once | INTRAMUSCULAR | Status: AC
  Administered 2012-06-27: 4 mg via INTRAVENOUS
  Filled 2012-06-27: qty 2

## 2012-06-27 MED ORDER — ONDANSETRON HCL 4 MG PO TABS: 4.0000 mg | ORAL_TABLET | Freq: Four times a day (QID) | ORAL | Status: DC | PRN

## 2012-06-27 MED ORDER — ONDANSETRON HCL 4 MG/2ML IJ SOLN: 4.0000 mg | Freq: Four times a day (QID) | INTRAMUSCULAR | Status: DC | PRN

## 2012-06-27 MED ORDER — MORPHINE SULFATE 4 MG/ML IJ SOLN
4.0000 mg | INTRAMUSCULAR | Status: DC | PRN
  Administered 2012-06-28: 4 mg via INTRAVENOUS
  Filled 2012-06-27: qty 1

## 2012-06-27 NOTE — ED Notes (Addendum)
Per EMS:  Pt from [name] ED, pt fell earlier and it was found it had a C-2 and clavicle fracture.  No neuro deficits noted.  Pt not really complaining of much pain.

## 2012-06-27 NOTE — H&P (Signed)
TREVIONNE ADVANI is an 79 y.o. male.   Chief Complaint: Fall; right shoulder pain HPI: 79 yo male presents after a fall down several stairs.  The patient ambulates with a walker and was coming down the stairs, carrying some groceries and using his walker, when he fell.  Apparently he landed on his right side.  + loss of consciousness.  Complaining of pain in his right shoulder and right hip, but the right hip pain is chronic.  Evaluated at [facility] and transferred after discussion with Neurosurgery.  Past Medical History  Diagnosis Date  . Atrial fibrillation   . Long term (current) use of anticoagulants     Coumadin  . Occlusion and stenosis of carotid artery without mention of cerebral infarction   . Unspecified essential hypertension   . Elevated prostate specific antigen (PSA)   . Polymyalgia rheumatica   . Macular degeneration   . Epistaxis   . Carcinomas, basal cell   . Lower extremity weakness   . Lacunar stroke     Histiry of small right MCA Lacunar Stroke  . Peripheral edema     Past Surgical History  Procedure Date  . Femur fracture surgery     Open reduction and internal fixation of the left intertochanteric femur fracture. [name] Charlann Boxer, M.D.  . Hip surgery 2010    Right hip  . Hip surgery 2009    Left hip  . Back surgery 1985  . Brain surgery 1995    blood clot/metal plates in skull  . Hernia repair 1953  . Cardioversion   . Inguinal hernia repair 2012    right, [facility]  . Cataract extraction w/phaco 01/02/2012    Procedure: CATARACT EXTRACTION PHACO AND INTRAOCULAR LENS PLACEMENT (IOC);  Surgeon: Gemma Payor, MD;  Location: [facility];  Service: Ophthalmology;  Laterality: Left;  CDE 16.83    Family History  Problem Relation Age of Onset  . Stroke Mother 56   Social History:  reports that he has never smoked. He does not have any smokeless tobacco history on file. He reports that he drinks alcohol. He reports that he does not use illicit drugs.  Allergies:    Allergies  Allergen Reactions  . Penicillins Swelling    Face only.   Current Outpatient Rx  Name  Route  Sig  Dispense  Refill  . BENAZEPRIL HCL 10 MG PO TABS  Oral  Take 10 mg by mouth daily.  Marland Kitchen DILTIAZEM HCL ER COATED BEADS 240 MG PO CP24  Oral  Take 240 mg by mouth daily.  . FUROSEMIDE 20 MG PO TABS  Oral  Take 1 tablet (20 mg total) by mouth daily.  90 tablet  3  . WARFARIN SODIUM 2 MG PO TABS  Oral  Take 2 mg by mouth as directed. Takes on Monday, Wednesday, & Friday  . WARFARIN SODIUM 4 MG PO TABS  Oral  Take 4 mg by mouth as directed. Takes on Sunday, Tuesday, Thursday, & Saturday  . CALCIUM CARBONATE-VITAMIN D 500-200 MG-UNIT PO TABS  Oral  Take 1 tablet by mouth 2 (two) times daily.  Marland Kitchen HYDROCODONE-ACETAMINOPHEN 5-500 MG PO TABS  Oral  Take 1 tablet by mouth every 6 (six) hours as needed. For pain  . MULTI-VITAMIN/MINERALS PO TABS  Oral  Take 1 tablet by mouth daily.  Marland Kitchen POLYVINYL ALCOHOL-POVIDONE 1.4-0.6 % OP SOLN  Both Eyes  Place 1-2 drops into both eyes every 4 (four) hours.     Results for orders  placed during the hospital encounter of 06/27/12 (from the past 48 hour(s))  CBC WITH DIFFERENTIAL     Status: Abnormal   Collection Time   06/27/12  2:59 PM      Component Value Range Comment   WBC 13.4 (*) 4.0 - 10.5 K/uL    RBC 4.00 (*) 4.22 - 5.81 MIL/uL    Hemoglobin 12.6 (*) 13.0 - 17.0 g/dL    HCT 19.1 (*) 47.8 - 52.0 %    MCV 95.5  78.0 - 100.0 fL    MCH 31.5  26.0 - 34.0 pg    MCHC 33.0  30.0 - 36.0 g/dL    RDW 29.5  62.1 - 30.8 %    Platelets 182  150 - 400 K/uL    Neutrophils Relative 87 (*) 43 - 77 %    Neutro Abs 11.6 (*) 1.7 - 7.7 K/uL    Lymphocytes Relative 7 (*) 12 - 46 %    Lymphs Abs 0.9  0.7 - 4.0 K/uL    Monocytes Relative 5  3 - 12 %    Monocytes Absolute 0.7  0.1 - 1.0 K/uL    Eosinophils Relative 1  0 - 5 %    Eosinophils Absolute 0.1  0.0 - 0.7 K/uL    Basophils Relative 0  0 - 1 %    Basophils Absolute 0.0  0.0 - 0.1  K/uL   BASIC METABOLIC PANEL     Status: Abnormal   Collection Time   06/27/12  2:59 PM      Component Value Range Comment   Sodium 139  135 - 145 mEq/L    Potassium 4.2  3.5 - 5.1 mEq/L    Chloride 103  96 - 112 mEq/L    CO2 24  19 - 32 mEq/L    Glucose, Bld 118 (*) 70 - 99 mg/dL    BUN 29 (*) 6 - 23 mg/dL    Creatinine, Ser 6.57  0.50 - 1.35 mg/dL    Calcium 9.1  8.4 - 84.6 mg/dL    GFR calc non Af Amer 53 (*) >90 mL/min    GFR calc Af Amer 62 (*) >90 mL/min   APTT     Status: Normal   Collection Time   06/27/12  2:59 PM      Component Value Range Comment   aPTT 28  24 - 37 seconds   PROTIME-INR     Status: Abnormal   Collection Time   06/27/12  2:59 PM      Component Value Range Comment   Prothrombin Time 16.6 (*) 11.6 - 15.2 seconds    INR 1.38  0.00 - 1.49   URINALYSIS, ROUTINE W REFLEX MICROSCOPIC     Status: Abnormal   Collection Time   06/27/12  5:45 PM      Component Value Range Comment   Color, Urine YELLOW  YELLOW    APPearance CLEAR  CLEAR    Specific Gravity, Urine 1.020  1.005 - 1.030    pH 5.5  5.0 - 8.0    Glucose, UA NEGATIVE  NEGATIVE mg/dL    Hgb urine dipstick SMALL (*) NEGATIVE    Bilirubin Urine NEGATIVE  NEGATIVE    Ketones, ur NEGATIVE  NEGATIVE mg/dL    Protein, ur NEGATIVE  NEGATIVE mg/dL    Urobilinogen, UA 0.2  0.0 - 1.0 mg/dL    Nitrite NEGATIVE  NEGATIVE    Leukocytes, UA NEGATIVE  NEGATIVE   URINE MICROSCOPIC-ADD ON  Status: Normal   Collection Time   06/27/12  5:45 PM      Component Value Range Comment   WBC, UA 0-2  <3 WBC/hpf    RBC / HPF 0-2  <3 RBC/hpf    Dg Clavicle Right  06/27/2012  *RADIOLOGY REPORT*  Clinical Data: Fall, right shoulder pain  RIGHT CLAVICLE - 2+ VIEWS  Comparison: None.  Findings: There is a comminuted distal right clavicular fracture. Right lung apex is grossly clear.  AC joint distance is grossly normal allowing for technique.  IMPRESSION: Comminuted distal right clavicular fracture.   Original Report  Authenticated By: Christiana Pellant, M.D.    Dg Shoulder Right  06/27/2012  *RADIOLOGY REPORT*  Clinical Data: Fall with right shoulder pain.  RIGHT SHOULDER - 2+ VIEW  Comparison: Right clavicle 06/27/2012.  Findings: There is a mildly displaced fracture of the distal right clavicle.  Acromioclavicular joint appears grossly intact. Glenohumeral joint is unremarkable.  Visualized portion of the right chest shows right apical pleural thickening.  IMPRESSION: Distal right clavicle fracture.   Original Report Authenticated By: Leanna Battles, M.D.    Dg Hip Complete Right  06/27/2012  *RADIOLOGY REPORT*  Clinical Data: Fall with right hip pain.  RIGHT HIP - COMPLETE 2+ VIEW  Comparison: 03/24/2009.  Findings: Dynamic hip screw and rod fixations are seen in the proximal femora bilaterally.  The screw on the right has retracted since the prior examination.  Marked secondary degenerative change in the left hip with flattening of the left femoral head.  The screw tip projects at the femoral cortex just beyond.  Degenerative changes seen in the spine.  No evidence of acute fracture.  IMPRESSION:  1.  No acute findings. 2.  Old right femur fracture repair with retracted dynamic hip screw. 3.  Old left femur fracture repair with flattening of the left femoral head and progressive secondary degenerative changes in the left hip.  The dynamic hip screw tip projects at the cortex of the left femoral head or just beyond.   Original Report Authenticated By: Leanna Battles, M.D.    Ct Head Wo Contrast  06/27/2012  *RADIOLOGY REPORT*  Clinical Data:  Basal cell carcinoma.  History of prior subdural hematoma.  Fall today with head and neck pain.  CT HEAD WITHOUT CONTRAST CT CERVICAL SPINE WITHOUT CONTRAST  Technique:  Multidetector CT imaging of the head and cervical spine was performed following the standard protocol without intravenous contrast.  Multiplanar CT image reconstructions of the cervical spine were also generated.   Comparison:   None  CT HEAD  Findings: There is no evidence for mass lesion, midline shift, hydrocephalus or acute margin.  8 mm hyperdense focus identified in the posterior right sylvian fissure, suggesting a small focus of acute subarachnoid hemorrhage. Diffuse loss of parenchymal volume is consistent with atrophy. Patchy low attenuation in the deep hemispheric and periventricular white matter is nonspecific, but likely reflects chronic microvascular ischemic demyelination.  The visualized paranasal sinuses and mastoid air cells are clear. Bilateral frontal craniectomy defects are evident.  IMPRESSION: Small hemorrhagic focus in the posterior right sylvian fissure suggests a tiny focus of sub arachnoid hemorrhage.  Atrophy with chronic small vessel white matter disease.  CT CERVICAL SPINE  Findings: Imaging was obtained from the skull base through the T1-2 interspace. There is a subtle nondisplaced fracture involving the superior articular surface of the left C2 lateral mass.  This is associated with slight subluxation of the C1 lateral mass relative to C2 on  the left.  No other acute fracture is identified.  There is mild anterolisthesis of C4 on five, compatible with the degree of facet osteoarthritis at the same level.  Similar changes are seen at C6- 7.  No evidence for prevertebral soft tissue swelling. Straightening of the normal cervical lordosis is evident.  IMPRESSION: Subtle nondisplaced fracture involving the superior articular surface of the left C2 lateral mass.  No other acute cervical spine fracture is evident.  Degenerative disc and facet disease.  Loss of cervical lordosis.  This can be related to patient positioning, muscle spasm or soft tissue injury.  I personally called the results of this exam to Dr. Deretha Emory in the emergency department at to 1708 hours on 06/27/2012.   Original Report Authenticated By: Kennith Center, M.D.    Ct Cervical Spine Wo Contrast  06/27/2012  *RADIOLOGY REPORT*   Clinical Data:  Basal cell carcinoma.  History of prior subdural hematoma.  Fall today with head and neck pain.  CT HEAD WITHOUT CONTRAST CT CERVICAL SPINE WITHOUT CONTRAST  Technique:  Multidetector CT imaging of the head and cervical spine was performed following the standard protocol without intravenous contrast.  Multiplanar CT image reconstructions of the cervical spine were also generated.  Comparison:   None  CT HEAD  Findings: There is no evidence for mass lesion, midline shift, hydrocephalus or acute margin.  8 mm hyperdense focus identified in the posterior right sylvian fissure, suggesting a small focus of acute subarachnoid hemorrhage. Diffuse loss of parenchymal volume is consistent with atrophy. Patchy low attenuation in the deep hemispheric and periventricular white matter is nonspecific, but likely reflects chronic microvascular ischemic demyelination.  The visualized paranasal sinuses and mastoid air cells are clear. Bilateral frontal craniectomy defects are evident.  IMPRESSION: Small hemorrhagic focus in the posterior right sylvian fissure suggests a tiny focus of sub arachnoid hemorrhage.  Atrophy with chronic small vessel white matter disease.  CT CERVICAL SPINE  Findings: Imaging was obtained from the skull base through the T1-2 interspace. There is a subtle nondisplaced fracture involving the superior articular surface of the left C2 lateral mass.  This is associated with slight subluxation of the C1 lateral mass relative to C2 on the left.  No other acute fracture is identified.  There is mild anterolisthesis of C4 on five, compatible with the degree of facet osteoarthritis at the same level.  Similar changes are seen at C6- 7.  No evidence for prevertebral soft tissue swelling. Straightening of the normal cervical lordosis is evident.  IMPRESSION: Subtle nondisplaced fracture involving the superior articular surface of the left C2 lateral mass.  No other acute cervical spine fracture is  evident.  Degenerative disc and facet disease.  Loss of cervical lordosis.  This can be related to patient positioning, muscle spasm or soft tissue injury.  I personally called the results of this exam to Dr. Deretha Emory in the emergency department at to 1708 hours on 06/27/2012.   Original Report Authenticated By: Kennith Center, M.D.     ROS  Blood pressure 138/73, pulse 84, temperature 98.3 F (36.8 C), temperature source Oral, resp. rate 16, SpO2 92.00%. Physical Exam  Elderly male in NAD Awake alert Neck - immobilized; mildly tender to palpation Lungs - CTA B CV - RRR Chest - tender of distal right clavicle Abd - soft, non-tender Right hip - healed surgical incision; no bruising; no tenderness to palpation  Assessment/Plan 1.  Fall 2.  Anticoagulated 3.  Small subarachnoid hemorrhage 4.  C2  left lateral mass non-displaced fracture 5.  Comminuted right distal clavicle fracture  Admit to ICU overnight for close observation Neurosurgery has seen the patient Aspen collar Ortho consult for clavicle fracture   Cohl Behrens K. 06/27/2012, 8:49 PM

## 2012-06-27 NOTE — Progress Notes (Signed)
Orthopedic Tech Progress Note Patient Details:  [name] 1922/05/25 [id number]  Ortho Devices Type of Ortho Device: Shoulder immobilizer Ortho Device/Splint Location: (R) UE Ortho Device/Splint Interventions: Application   Jennye Moccasin 06/27/2012, 9:41 PM

## 2012-06-27 NOTE — ED Notes (Signed)
Paged Dr. Franky Macho to [phone]

## 2012-06-27 NOTE — ED Provider Notes (Signed)
History    This chart was scribed for Ward Givens, MD, MD by Smitty Pluck, ED Scribe. The patient was seen in room [facility] and the patient's care was started at 3:18 PM.   CSN: [id number]  Arrival date & time 06/27/12  1402       Chief Complaint  Patient presents with  . Fall    (Consider location/radiation/quality/duration/timing/severity/associated sxs/prior treatment) Patient is a 79 y.o. male presenting with fall. The history is provided by the patient. No language interpreter was used.  Fall The accident occurred 3 to 5 hours ago. The fall occurred while walking. He fell from a height of 3 to 5 ft. He landed on a hard floor. The point of impact was the right hip and right shoulder. The pain is present in the right hip and right shoulder. The pain is moderate. There was no entrapment after the fall. There was no drug use involved in the accident. There was no alcohol use involved in the accident. Treatment on scene includes a c-collar. He has tried nothing for the symptoms.   [name] [name] is a 79 y.o. male who presents to the Emergency Department complaining of fall today. Pt reports that he was walking down steps and tripped down 4-6 steps. Pt reports that he has constant, moderate right shoulder pain and right hip pain. He was carrying groceries and using walker while walking down steps. Pt reports that he had LOC and is unsure if he hit his head. The pt's wife's caregiver reports seeing pt unconscious after the fall. And he woke up after she started screaming his name for a short period of time. He denies headache now and any other pain. He has hx of two hip surgeries by Dr. Charlann Boxer and Dr. Myra Rude of [facility]. 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   Pt is scheduled to have coumadin checked in 2 days. He has a hx of a fib.   Pt denies smoking and drinking alcohol.   PCP Dr Sherril Croon in [facility] Dr. Lorelle Gibbs is cardiologist   Past Medical History  Diagnosis Date  .  Atrial fibrillation   . Long term (current) use of anticoagulants     Coumadin  . Occlusion and stenosis of carotid artery without mention of cerebral infarction   . Unspecified essential hypertension   . Elevated prostate specific antigen (PSA)   . Polymyalgia rheumatica   . Macular degeneration   . Epistaxis   . Carcinomas, basal cell   . Lower extremity weakness   . Lacunar stroke     Histiry of small right MCA Lacunar Stroke  . Peripheral edema     Past Surgical History  Procedure Date  . Femur fracture surgery     Open reduction and internal fixation of the left intertochanteric femur fracture. [name] Charlann Boxer, M.D.  . Hip surgery 2010    Right hip  . Hip surgery 2009    Left hip  . Back surgery 1985  . Brain surgery 1995    blood clot/metal plates in skull  . Hernia repair 1953  . Cardioversion   . Inguinal hernia repair 2012    right, [facility]  . Cataract extraction w/phaco 01/02/2012    Procedure: CATARACT EXTRACTION PHACO AND INTRAOCULAR LENS PLACEMENT (IOC);  Surgeon: Gemma Payor, MD;  Location: [facility];  Service: Ophthalmology;  Laterality: Left;  CDE 16.83    Family History  Problem Relation Age of Onset  . Stroke Mother  87    History  Substance Use Topics  . Smoking status: Never Smoker   . Smokeless tobacco: Not on file  . Alcohol Use: Yes  + second hand smoke (wife smoked) Lives at home Lives with spouse Uses a walker   Review of Systems  Musculoskeletal: Positive for arthralgias.  All other systems reviewed and are negative.    Allergies  Penicillins  Home Medications   Current Outpatient Rx  Name  Route  Sig  Dispense  Refill  . BENAZEPRIL HCL 10 MG PO TABS   Oral   Take 10 mg by mouth daily.           Marland Kitchen DILTIAZEM HCL ER COATED BEADS 240 MG PO CP24   Oral   Take 240 mg by mouth daily.           . FUROSEMIDE 20 MG PO TABS   Oral   Take 1 tablet (20 mg total) by mouth daily.   90 tablet   3   . WARFARIN SODIUM 2 MG PO TABS    Oral   Take 2 mg by mouth as directed. Takes on Monday, Wednesday, & Friday         . WARFARIN SODIUM 4 MG PO TABS   Oral   Take 4 mg by mouth as directed. Takes on Sunday, Tuesday, Thursday, & Saturday         . CALCIUM CARBONATE-VITAMIN D 500-200 MG-UNIT PO TABS   Oral   Take 1 tablet by mouth 2 (two) times daily.           Marland Kitchen HYDROCODONE-ACETAMINOPHEN 5-500 MG PO TABS   Oral   Take 1 tablet by mouth every 6 (six) hours as needed. For pain         . MULTI-VITAMIN/MINERALS PO TABS   Oral   Take 1 tablet by mouth daily.           Marland Kitchen POLYVINYL ALCOHOL-POVIDONE 1.4-0.6 % OP SOLN   Both Eyes   Place 1-2 drops into both eyes every 4 (four) hours.           BP 150/64  Pulse 75  Temp 97.8 F (36.6 C) (Oral)  Resp 17  SpO2 97%  Vital signs normal    Physical Exam  Nursing note and vitals reviewed. Constitutional: He is oriented to person, place, and time.  Non-toxic appearance. He does not appear ill. No distress.       Elderly frail man  HENT:  Head: Normocephalic and atraumatic.  Right Ear: External ear normal.  Left Ear: External ear normal.  Nose: Nose normal. No mucosal edema or rhinorrhea.  Mouth/Throat: Oropharynx is clear and moist and mucous membranes are normal. No dental abscesses or uvula swelling.       Head nontender  Eyes: Conjunctivae normal and EOM are normal. Pupils are equal, round, and reactive to light.  Neck:       c-collar in place   Cardiovascular: Normal rate, regular rhythm and normal heart sounds.  Exam reveals no gallop and no friction rub.   No murmur heard. Pulmonary/Chest: Effort normal and breath sounds normal. No respiratory distress. He has no wheezes. He has no rhonchi. He has no rales. He exhibits no tenderness and no crepitus.       Chest nontender  Abdominal: Soft. Normal appearance and bowel sounds are normal. He exhibits no distension. There is no tenderness. There is no rebound and no guarding.  Musculoskeletal:  Tender in right collar bone laterally right shoulder is tender, ROM not attempted before getting xrays No pain on reflexion and extension of right elbow No pain on supination/pronation of the R elbow or R wrist No R wrist pain  No ecchymosis of the right hip or shoulder right lateral hip tenderness without obvious shortening or ext/int rotation.  Left thumb is bruised.  Neurological: He is alert and oriented to person, place, and time. He has normal strength. No cranial nerve deficit.  Skin: Skin is warm, dry and intact. No rash noted. No erythema. No pallor.  Psychiatric: He has a normal mood and affect. His speech is normal and behavior is normal. His mood appears not anxious.    ED Course  Procedures (including critical care time)    DIAGNOSTIC STUDIES: Oxygen Saturation is 97% on room air normal by my interpretation.    COORDINATION OF CARE: 3:23 PM Discussed ED treatment with pt  3:30 PM Ordered:   Medications  morphine 4 MG/ML injection 4 mg (4 mg Intravenous Given 06/27/12 1549)  ondansetron (ZOFRAN) injection 4 mg (4 mg Intravenous Given 06/27/12 1549)   4:48 PM Recheck. Discussed pt imagining and lab results with pt.   5:52 PM Recheck: Discussed additional scan results with pt and pt family. Pt will be discussed with Neurosurgeon. Pt left in his collar. He was placed in a sling for his clavicle fracture  6:37- 6:43 PM Dr Franky Macho through OR nurse, have trauma admit  6:50 PM Dr. Franky Macho (neurosurgeon) has viewed pt's scans and advises trauma admit  6:55 PM Dr. Corliss Skains (trauma surgeon) accepts pt. For further evaluation and treatment   19:19 Dr Bebe Shaggy, ED attending made aware of transfer  19:25 Reexamine, abdomen soft and nontender, has tenderness around his lateral right clavicle, otherwise chest/ribs are nontender.   19:30 EMS taking patient to [facility] ED.   19:34 Olegario Messier, charge nurse at [facility] ED, made aware of patient transfer.   Results for orders placed during the  hospital encounter of 06/27/12  CBC WITH DIFFERENTIAL      Component Value Range   WBC 13.4 (*) 4.0 - 10.5 K/uL   RBC 4.00 (*) 4.22 - 5.81 MIL/uL   Hemoglobin 12.6 (*) 13.0 - 17.0 g/dL   HCT 16.1 (*) 09.6 - 04.5 %   MCV 95.5  78.0 - 100.0 fL   MCH 31.5  26.0 - 34.0 pg   MCHC 33.0  30.0 - 36.0 g/dL   RDW 40.9  81.1 - 91.4 %   Platelets 182  150 - 400 K/uL   Neutrophils Relative 87 (*) 43 - 77 %   Neutro Abs 11.6 (*) 1.7 - 7.7 K/uL   Lymphocytes Relative 7 (*) 12 - 46 %   Lymphs Abs 0.9  0.7 - 4.0 K/uL   Monocytes Relative 5  3 - 12 %   Monocytes Absolute 0.7  0.1 - 1.0 K/uL   Eosinophils Relative 1  0 - 5 %   Eosinophils Absolute 0.1  0.0 - 0.7 K/uL   Basophils Relative 0  0 - 1 %   Basophils Absolute 0.0  0.0 - 0.1 K/uL  BASIC METABOLIC PANEL      Component Value Range   Sodium 139  135 - 145 mEq/L   Potassium 4.2  3.5 - 5.1 mEq/L   Chloride 103  96 - 112 mEq/L   CO2 24  19 - 32 mEq/L   Glucose, Bld 118 (*) 70 - 99 mg/dL   BUN  29 (*) 6 - 23 mg/dL   Creatinine, Ser 1.61  0.50 - 1.35 mg/dL   Calcium 9.1  8.4 - 09.6 mg/dL   GFR calc non Af Amer 53 (*) >90 mL/min   GFR calc Af Amer 62 (*) >90 mL/min  URINALYSIS, ROUTINE W REFLEX MICROSCOPIC      Component Value Range   Color, Urine YELLOW  YELLOW   APPearance CLEAR  CLEAR   Specific Gravity, Urine 1.020  1.005 - 1.030   pH 5.5  5.0 - 8.0   Glucose, UA NEGATIVE  NEGATIVE mg/dL   Hgb urine dipstick SMALL (*) NEGATIVE   Bilirubin Urine NEGATIVE  NEGATIVE   Ketones, ur NEGATIVE  NEGATIVE mg/dL   Protein, ur NEGATIVE  NEGATIVE mg/dL   Urobilinogen, UA 0.2  0.0 - 1.0 mg/dL   Nitrite NEGATIVE  NEGATIVE   Leukocytes, UA NEGATIVE  NEGATIVE  APTT      Component Value Range   aPTT 28  24 - 37 seconds  PROTIME-INR      Component Value Range   Prothrombin Time 16.6 (*) 11.6 - 15.2 seconds   INR 1.38  0.00 - 1.49  URINE MICROSCOPIC-ADD ON      Component Value Range   WBC, UA 0-2  <3 WBC/hpf   RBC / HPF 0-2  <3 RBC/hpf    Laboratory interpretation all normal except leukocytosis, mild anemia    Dg Clavicle Right  06/27/2012  *RADIOLOGY REPORT*  Clinical Data: Fall, right shoulder pain  RIGHT CLAVICLE - 2+ VIEWS  Comparison: None.  Findings: There is a comminuted distal right clavicular fracture. Right lung apex is grossly clear.  AC joint distance is grossly normal allowing for technique.  IMPRESSION: Comminuted distal right clavicular fracture.   Original Report Authenticated By: Christiana Pellant, M.D.    Dg Shoulder Right  06/27/2012  *RADIOLOGY REPORT*  Clinical Data: Fall with right shoulder pain.  RIGHT SHOULDER - 2+ VIEW  Comparison: Right clavicle 06/27/2012.  Findings: There is a mildly displaced fracture of the distal right clavicle.  Acromioclavicular joint appears grossly intact. Glenohumeral joint is unremarkable.  Visualized portion of the right chest shows right apical pleural thickening.  IMPRESSION: Distal right clavicle fracture.   Original Report Authenticated By: Leanna Battles, M.D.    Dg Hip Complete Right  06/27/2012  *RADIOLOGY REPORT*  Clinical Data: Fall with right hip pain.  RIGHT HIP - COMPLETE 2+ VIEW  Comparison: 03/24/2009.  Findings: Dynamic hip screw and rod fixations are seen in the proximal femora bilaterally.  The screw on the right has retracted since the prior examination.  Marked secondary degenerative change in the left hip with flattening of the left femoral head.  The screw tip projects at the femoral cortex just beyond.  Degenerative changes seen in the spine.  No evidence of acute fracture.  IMPRESSION:  1.  No acute findings. 2.  Old right femur fracture repair with retracted dynamic hip screw. 3.  Old left femur fracture repair with flattening of the left femoral head and progressive secondary degenerative changes in the left hip.  The dynamic hip screw tip projects at the cortex of the left femoral head or just beyond.   Original Report Authenticated By: Leanna Battles, M.D.     Ct Head Wo Contrast  Ct Cervical Spine Wo Contrast  06/27/2012  *RADIOLOGY REPORT*  Clinical Data:  Basal cell carcinoma.  History of prior subdural hematoma.  Fall today with head and neck pain.  CT HEAD WITHOUT  CONTRAST CT CERVICAL SPINE WITHOUT CONTRAST  Technique:  Multidetector CT imaging of the head and cervical spine was performed following the standard protocol without intravenous contrast.  Multiplanar CT image reconstructions of the cervical spine were also generated.  Comparison:   None  CT HEAD  Findings: There is no evidence for mass lesion, midline shift, hydrocephalus or acute margin.  8 mm hyperdense focus identified in the posterior right sylvian fissure, suggesting a small focus of acute subarachnoid hemorrhage. Diffuse loss of parenchymal volume is consistent with atrophy. Patchy low attenuation in the deep hemispheric and periventricular white matter is nonspecific, but likely reflects chronic microvascular ischemic demyelination.  The visualized paranasal sinuses and mastoid air cells are clear. Bilateral frontal craniectomy defects are evident.  IMPRESSION: Small hemorrhagic focus in the posterior right sylvian fissure suggests a tiny focus of sub arachnoid hemorrhage.  Atrophy with chronic small vessel white matter disease.  CT CERVICAL SPINE  Findings: Imaging was obtained from the skull base through the T1-2 interspace. There is a subtle nondisplaced fracture involving the superior articular surface of the left C2 lateral mass.  This is associated with slight subluxation of the C1 lateral mass relative to C2 on the left.  No other acute fracture is identified.  There is mild anterolisthesis of C4 on five, compatible with the degree of facet osteoarthritis at the same level.  Similar changes are seen at C6- 7.  No evidence for prevertebral soft tissue swelling. Straightening of the normal cervical lordosis is evident.  IMPRESSION: Subtle nondisplaced fracture involving the superior  articular surface of the left C2 lateral mass.  No other acute cervical spine fracture is evident.  Degenerative disc and facet disease.  Loss of cervical lordosis.  This can be related to patient positioning, muscle spasm or soft tissue injury.  I personally called the results of this exam to Dr. Deretha Emory in the emergency department at to 1708 hours on 06/27/2012.   Original Report Authenticated By: Kennith Center, M.D.      Date: 06/27/2012  Rate: 88  Rhythm: atrial fibrillation and premature ventricular contractions (PVC)  QRS Axis: normal  Intervals: normal  ST/T Wave abnormalities: normal  Conduction Disutrbances:none  Narrative Interpretation:   Old EKG Reviewed: unchanged from  12/27/2011    1. Fall   2. C2 cervical fracture   3. Fracture of clavicle, right, closed   4. Subarachnoid bleed     Plan transfer to [facility] ED to see Trauma and Neurosurgery  CRITICAL CARE Performed by: Devoria Albe L   Total critical care time: 35 minutes  Critical care time was exclusive of separately billable procedures and treating other patients.  Critical care was necessary to treat or prevent imminent or life-threatening deterioration.  Critical care was time spent personally by me on the following activities: development of treatment plan with patient and/or surrogate as well as nursing, discussions with consultants, evaluation of patient's response to treatment, examination of patient, obtaining history from patient or surrogate, ordering and performing treatments and interventions, ordering and review of laboratory studies, ordering and review of radiographic studies, pulse oximetry and re-evaluation of patient's condition.   MDM   I personally performed the services described in this documentation, which was scribed in my presence. The recorded information has been reviewed and considered.  Devoria Albe, MD, Armando Gang    Ward Givens, MD 06/27/12 [phone]

## 2012-06-27 NOTE — ED Notes (Signed)
Pt transferred to [facility] Ed per orders via [facility]. Pt has c-collar intact. PIV patent and infusing NS at [facility] without difficulty.

## 2012-06-27 NOTE — ED Notes (Signed)
Pt's 02 dropped to 88% on RA, placed pt on 2L [place], 02 up to 96%.

## 2012-06-27 NOTE — ED Notes (Addendum)
Pt was attempting to carry groceries in the house well using a walker up the outdoor stairs falling striking the rt side of his head. Pt has small amount bleeding noted at top of ear. Bleeding controlled. Pt c/o rt shoulder, rt hip,and neck pain at this time. Pt states LOC briefly being found by a family care aide.  Pt is alert and oriented x 4. Daughter at pt's bedside.

## 2012-06-27 NOTE — Consult Note (Signed)
Reason for Consult:Intracerebral contusion, C2 left superior articular surface non displaced fracture Referring Physician: Burman Blacksmith is an 79 y.o. male.  HPI: Whom was walking down stairs when he fell this evening. He was reported to have lost consciousness for a brief period. Complained of right shoulder and hip pain. Xrays revealed no right hip or lower extremity fracture, a right distal clavicle fracture. Head CT revealed a small contusion in the distal right sylvian fissure. Normal GCS at [facility]. INR was normal despite coumadin therapy for atrial fibrillation.   Past Medical History  Diagnosis Date  . Atrial fibrillation   . Long term (current) use of anticoagulants     Coumadin  . Occlusion and stenosis of carotid artery without mention of cerebral infarction   . Unspecified essential hypertension   . Elevated prostate specific antigen (PSA)   . Polymyalgia rheumatica   . Macular degeneration   . Epistaxis   . Carcinomas, basal cell   . Lower extremity weakness   . Lacunar stroke     Histiry of small right MCA Lacunar Stroke  . Peripheral edema     Past Surgical History  Procedure Date  . Femur fracture surgery     Open reduction and internal fixation of the left intertochanteric femur fracture. [name] Charlann Boxer, M.D.  . Hip surgery 2010    Right hip  . Hip surgery 2009    Left hip  . Back surgery 1985  . Brain surgery 1995    blood clot/metal plates in skull  . Hernia repair 1953  . Cardioversion   . Inguinal hernia repair 2012    right, [facility]  . Cataract extraction w/phaco 01/02/2012    Procedure: CATARACT EXTRACTION PHACO AND INTRAOCULAR LENS PLACEMENT (IOC);  Surgeon: Gemma Payor, MD;  Location: [facility];  Service: Ophthalmology;  Laterality: Left;  CDE 16.83    Family History  Problem Relation Age of Onset  . Stroke Mother 68    Social History:  reports that he has never smoked. He does not have any smokeless tobacco history on file. He  reports that he drinks alcohol. He reports that he does not use illicit drugs.  Allergies:  Allergies  Allergen Reactions  . Penicillins Swelling    Face only.    Medications: I have reviewed the patient's current medications.  Results for orders placed during the hospital encounter of 06/27/12 (from the past 48 hour(s))  CBC WITH DIFFERENTIAL     Status: Abnormal   Collection Time   06/27/12  2:59 PM      Component Value Range Comment   WBC 13.4 (*) 4.0 - 10.5 K/uL    RBC 4.00 (*) 4.22 - 5.81 MIL/uL    Hemoglobin 12.6 (*) 13.0 - 17.0 g/dL    HCT 96.0 (*) 45.4 - 52.0 %    MCV 95.5  78.0 - 100.0 fL    MCH 31.5  26.0 - 34.0 pg    MCHC 33.0  30.0 - 36.0 g/dL    RDW 09.8  11.9 - 14.7 %    Platelets 182  150 - 400 K/uL    Neutrophils Relative 87 (*) 43 - 77 %    Neutro Abs 11.6 (*) 1.7 - 7.7 K/uL    Lymphocytes Relative 7 (*) 12 - 46 %    Lymphs Abs 0.9  0.7 - 4.0 K/uL    Monocytes Relative 5  3 - 12 %    Monocytes Absolute 0.7  0.1 -  1.0 K/uL    Eosinophils Relative 1  0 - 5 %    Eosinophils Absolute 0.1  0.0 - 0.7 K/uL    Basophils Relative 0  0 - 1 %    Basophils Absolute 0.0  0.0 - 0.1 K/uL   BASIC METABOLIC PANEL     Status: Abnormal   Collection Time   06/27/12  2:59 PM      Component Value Range Comment   Sodium 139  135 - 145 mEq/L    Potassium 4.2  3.5 - 5.1 mEq/L    Chloride 103  96 - 112 mEq/L    CO2 24  19 - 32 mEq/L    Glucose, Bld 118 (*) 70 - 99 mg/dL    BUN 29 (*) 6 - 23 mg/dL    Creatinine, Ser 1.61  0.50 - 1.35 mg/dL    Calcium 9.1  8.4 - 09.6 mg/dL    GFR calc non Af Amer 53 (*) >90 mL/min    GFR calc Af Amer 62 (*) >90 mL/min   APTT     Status: Normal   Collection Time   06/27/12  2:59 PM      Component Value Range Comment   aPTT 28  24 - 37 seconds   PROTIME-INR     Status: Abnormal   Collection Time   06/27/12  2:59 PM      Component Value Range Comment   Prothrombin Time 16.6 (*) 11.6 - 15.2 seconds    INR 1.38  0.00 - 1.49   URINALYSIS,  ROUTINE W REFLEX MICROSCOPIC     Status: Abnormal   Collection Time   06/27/12  5:45 PM      Component Value Range Comment   Color, Urine YELLOW  YELLOW    APPearance CLEAR  CLEAR    Specific Gravity, Urine 1.020  1.005 - 1.030    pH 5.5  5.0 - 8.0    Glucose, UA NEGATIVE  NEGATIVE mg/dL    Hgb urine dipstick SMALL (*) NEGATIVE    Bilirubin Urine NEGATIVE  NEGATIVE    Ketones, ur NEGATIVE  NEGATIVE mg/dL    Protein, ur NEGATIVE  NEGATIVE mg/dL    Urobilinogen, UA 0.2  0.0 - 1.0 mg/dL    Nitrite NEGATIVE  NEGATIVE    Leukocytes, UA NEGATIVE  NEGATIVE   URINE MICROSCOPIC-ADD ON     Status: Normal   Collection Time   06/27/12  5:45 PM      Component Value Range Comment   WBC, UA 0-2  <3 WBC/hpf    RBC / HPF 0-2  <3 RBC/hpf     Dg Clavicle Right  06/27/2012  *RADIOLOGY REPORT*  Clinical Data: Fall, right shoulder pain  RIGHT CLAVICLE - 2+ VIEWS  Comparison: None.  Findings: There is a comminuted distal right clavicular fracture. Right lung apex is grossly clear.  AC joint distance is grossly normal allowing for technique.  IMPRESSION: Comminuted distal right clavicular fracture.   Original Report Authenticated By: Christiana Pellant, M.D.    Dg Shoulder Right  06/27/2012  *RADIOLOGY REPORT*  Clinical Data: Fall with right shoulder pain.  RIGHT SHOULDER - 2+ VIEW  Comparison: Right clavicle 06/27/2012.  Findings: There is a mildly displaced fracture of the distal right clavicle.  Acromioclavicular joint appears grossly intact. Glenohumeral joint is unremarkable.  Visualized portion of the right chest shows right apical pleural thickening.  IMPRESSION: Distal right clavicle fracture.   Original Report Authenticated By: Leanna Battles, M.D.  Dg Hip Complete Right  06/27/2012  *RADIOLOGY REPORT*  Clinical Data: Fall with right hip pain.  RIGHT HIP - COMPLETE 2+ VIEW  Comparison: 03/24/2009.  Findings: Dynamic hip screw and rod fixations are seen in the proximal femora bilaterally.  The screw on  the right has retracted since the prior examination.  Marked secondary degenerative change in the left hip with flattening of the left femoral head.  The screw tip projects at the femoral cortex just beyond.  Degenerative changes seen in the spine.  No evidence of acute fracture.  IMPRESSION:  1.  No acute findings. 2.  Old right femur fracture repair with retracted dynamic hip screw. 3.  Old left femur fracture repair with flattening of the left femoral head and progressive secondary degenerative changes in the left hip.  The dynamic hip screw tip projects at the cortex of the left femoral head or just beyond.   Original Report Authenticated By: Leanna Battles, M.D.    Ct Head Wo Contrast  06/27/2012  *RADIOLOGY REPORT*  Clinical Data:  Basal cell carcinoma.  History of prior subdural hematoma.  Fall today with head and neck pain.  CT HEAD WITHOUT CONTRAST CT CERVICAL SPINE WITHOUT CONTRAST  Technique:  Multidetector CT imaging of the head and cervical spine was performed following the standard protocol without intravenous contrast.  Multiplanar CT image reconstructions of the cervical spine were also generated.  Comparison:   None  CT HEAD  Findings: There is no evidence for mass lesion, midline shift, hydrocephalus or acute margin.  8 mm hyperdense focus identified in the posterior right sylvian fissure, suggesting a small focus of acute subarachnoid hemorrhage. Diffuse loss of parenchymal volume is consistent with atrophy. Patchy low attenuation in the deep hemispheric and periventricular white matter is nonspecific, but likely reflects chronic microvascular ischemic demyelination.  The visualized paranasal sinuses and mastoid air cells are clear. Bilateral frontal craniectomy defects are evident.  IMPRESSION: Small hemorrhagic focus in the posterior right sylvian fissure suggests a tiny focus of sub arachnoid hemorrhage.  Atrophy with chronic small vessel white matter disease.  CT CERVICAL SPINE  Findings:  Imaging was obtained from the skull base through the T1-2 interspace. There is a subtle nondisplaced fracture involving the superior articular surface of the left C2 lateral mass.  This is associated with slight subluxation of the C1 lateral mass relative to C2 on the left.  No other acute fracture is identified.  There is mild anterolisthesis of C4 on five, compatible with the degree of facet osteoarthritis at the same level.  Similar changes are seen at C6- 7.  No evidence for prevertebral soft tissue swelling. Straightening of the normal cervical lordosis is evident.  IMPRESSION: Subtle nondisplaced fracture involving the superior articular surface of the left C2 lateral mass.  No other acute cervical spine fracture is evident.  Degenerative disc and facet disease.  Loss of cervical lordosis.  This can be related to patient positioning, muscle spasm or soft tissue injury.  I personally called the results of this exam to Dr. Deretha Emory in the emergency department at to 1708 hours on 06/27/2012.   Original Report Authenticated By: Kennith Center, M.D.    Ct Cervical Spine Wo Contrast  06/27/2012  *RADIOLOGY REPORT*  Clinical Data:  Basal cell carcinoma.  History of prior subdural hematoma.  Fall today with head and neck pain.  CT HEAD WITHOUT CONTRAST CT CERVICAL SPINE WITHOUT CONTRAST  Technique:  Multidetector CT imaging of the head and cervical spine was  performed following the standard protocol without intravenous contrast.  Multiplanar CT image reconstructions of the cervical spine were also generated.  Comparison:   None  CT HEAD  Findings: There is no evidence for mass lesion, midline shift, hydrocephalus or acute margin.  8 mm hyperdense focus identified in the posterior right sylvian fissure, suggesting a small focus of acute subarachnoid hemorrhage. Diffuse loss of parenchymal volume is consistent with atrophy. Patchy low attenuation in the deep hemispheric and periventricular white matter is nonspecific,  but likely reflects chronic microvascular ischemic demyelination.  The visualized paranasal sinuses and mastoid air cells are clear. Bilateral frontal craniectomy defects are evident.  IMPRESSION: Small hemorrhagic focus in the posterior right sylvian fissure suggests a tiny focus of sub arachnoid hemorrhage.  Atrophy with chronic small vessel white matter disease.  CT CERVICAL SPINE  Findings: Imaging was obtained from the skull base through the T1-2 interspace. There is a subtle nondisplaced fracture involving the superior articular surface of the left C2 lateral mass.  This is associated with slight subluxation of the C1 lateral mass relative to C2 on the left.  No other acute fracture is identified.  There is mild anterolisthesis of C4 on five, compatible with the degree of facet osteoarthritis at the same level.  Similar changes are seen at C6- 7.  No evidence for prevertebral soft tissue swelling. Straightening of the normal cervical lordosis is evident.  IMPRESSION: Subtle nondisplaced fracture involving the superior articular surface of the left C2 lateral mass.  No other acute cervical spine fracture is evident.  Degenerative disc and facet disease.  Loss of cervical lordosis.  This can be related to patient positioning, muscle spasm or soft tissue injury.  I personally called the results of this exam to Dr. Deretha Emory in the emergency department at to 1708 hours on 06/27/2012.   Original Report Authenticated By: Kennith Center, M.D.     Review of Systems  Constitutional: Negative.   HENT: Positive for hearing loss.   Genitourinary: Negative.   Musculoskeletal: Positive for falls.       Right shoulder pain  Skin: Negative.   Neurological: Negative.   Psychiatric/Behavioral: Negative.    Blood pressure 138/73, pulse 84, temperature 98.3 F (36.8 C), temperature source Oral, resp. rate 16, SpO2 92.00%. Physical Exam  Constitutional: He is oriented to person, place, and time. He appears  well-developed and well-nourished. No distress.  HENT:  Head: Normocephalic.  Right Ear: External ear normal.  Left Ear: External ear normal.  Eyes:       Pupils reactive to light bilaterally. Full eom Symmetric facial movements, symmetric and normal facial sensation Tongue and uvula midline Decreased hearing to voice bilaterally   Musculoskeletal:       Right shoulder pain, reduced range of motion  Neurological: He is alert and oriented to person, place, and time. He has normal reflexes. No cranial nerve deficit. He exhibits normal muscle tone. Coordination normal.       Normal muscle tone and bulk +shoulder shrug   Skin: Skin is warm and dry.  Psychiatric: He has a normal mood and affect. His behavior is normal. Judgment and thought content normal.    Assessment/Plan: Admit to the trauma service. Normal exam. I do not anticipate any operative treatment for the brain or cervical spine.  An Aspen collar will suffice for the C2 fracture. Does not need repeat Head CT with current exam. If mental status changes, declines, worsens, then repeat CT is indicated.  Will follow.  Elonda Giuliano L 06/27/2012, 8:45 PM

## 2012-06-27 NOTE — ED Notes (Signed)
May be awhile for [facility], so they asked [place] to call [facility] to transport to [facility] ED, who will  Send a truck as soon as possible.  Nurse informed.

## 2012-06-27 NOTE — ED Notes (Signed)
Pt walking down steps and family heard him fall. Unknown amount of steps. cbg 108. Pt arrived alert. c collar en route. Could not tolerate back board. Pt c/or shoulder pain.

## 2012-06-27 NOTE — ED Notes (Signed)
Notified ortho for arm sling

## 2012-06-27 NOTE — ED Notes (Signed)
Pt uncomfortable secondary to C-collar intact. Pt understands he must leave c-collar on until MD speaks with him. Both family and pt verbalized understanding.

## 2012-06-27 NOTE — ED Notes (Signed)
Ortho tech in with pt applying arm sling immobilizer

## 2012-06-28 ENCOUNTER — Inpatient Hospital Stay (HOSPITAL_COMMUNITY): Payer: Medicare Other

## 2012-06-28 ENCOUNTER — Encounter (HOSPITAL_COMMUNITY): Payer: Self-pay | Admitting: Radiology

## 2012-06-28 LAB — BASIC METABOLIC PANEL
CO2: 23 mEq/L (ref 19–32)
Chloride: 104 mEq/L (ref 96–112)
GFR calc Af Amer: 64 mL/min — ABNORMAL LOW (ref >90)
Potassium: 4.4 mEq/L (ref 3.5–5.1)
Sodium: 139 mEq/L (ref 135–145)

## 2012-06-28 LAB — CBC
HCT: 33.6 % — ABNORMAL LOW (ref 39.0–52.0)
Hemoglobin: 11.3 g/dL — ABNORMAL LOW (ref 13.0–17.0)
MCHC: 33.6 g/dL (ref 30.0–36.0)
RBC: 3.57 MIL/uL — ABNORMAL LOW (ref 4.22–5.81)
WBC: 8.5 10*3/uL (ref 4.0–10.5)

## 2012-06-28 LAB — PROTIME-INR
INR: 1.41 (ref 0.00–1.49)
Prothrombin Time: 16.9 seconds — ABNORMAL HIGH (ref 11.6–15.2)

## 2012-06-28 LAB — MRSA PCR SCREENING: MRSA by PCR: NEGATIVE

## 2012-06-28 MED ORDER — OXYCODONE HCL 5 MG PO TABS
5.0000 mg | ORAL_TABLET | ORAL | Status: DC | PRN
  Administered 2012-06-28 – 2012-07-01 (×3): 10 mg via ORAL
  Filled 2012-06-28 (×3): qty 2

## 2012-06-28 NOTE — Consult Note (Signed)
Orthopaedic Trauma Service Consult  Reason for Consult: R distal clavicle fx s/p fall Referring Physician:  Judie Petit. Tsuei, MD   HPI:     Pt is an 79 y/o RHD caucasian male who sustained a fall yesterday evening after returning home with groceries.  He was attempting to navigate some stairs with groceries and his walker when he lost his footing and fell.  Pt was brought to [place] for evaluation where he was found to have a C-spine fx, TBI and R distal clavicle fx.  OTS contacted regarding R distal clavicle fx.  Pt seen in 3113, appears to be comfortable. Complains only of neck pain and R shoulder pain.  Denies any other issues.  Denies numbness or tingling in extremities.   Past Medical History  Diagnosis Date  . Atrial fibrillation   . Long term (current) use of anticoagulants     Coumadin  . Occlusion and stenosis of carotid artery without mention of cerebral infarction   . Unspecified essential hypertension   . Elevated prostate specific antigen (PSA)   . Polymyalgia rheumatica   . Macular degeneration   . Epistaxis   . Carcinomas, basal cell   . Lower extremity weakness   . Lacunar stroke     Histiry of small right MCA Lacunar Stroke  . Peripheral edema     Past Surgical History  Procedure Date  . Femur fracture surgery     Open reduction and internal fixation of the left intertochanteric femur fracture. [name] Charlann Boxer, M.D.  . Hip surgery 2010    Right hip  . Hip surgery 2009    Left hip  . Back surgery 1985  . Brain surgery 1995    blood clot/metal plates in skull  . Hernia repair 1953  . Cardioversion   . Inguinal hernia repair 2012    right, [facility]  . Cataract extraction w/phaco 01/02/2012    Procedure: CATARACT EXTRACTION PHACO AND INTRAOCULAR LENS PLACEMENT (IOC);  Surgeon: Gemma Payor, MD;  Location: [facility];  Service: Ophthalmology;  Laterality: Left;  CDE 16.83    Family History  Problem Relation Age of Onset  . Stroke Mother 20    Social History:  reports  that he has never smoked. He does not have any smokeless tobacco history on file. He reports that he drinks alcohol. He reports that he does not use illicit drugs.  Allergies:  Allergies  Allergen Reactions  . Penicillins Swelling    Face only.    Medications: I have reviewed the patient's current medications.   Dg Clavicle Right  06/27/2012  *RADIOLOGY REPORT*  Clinical Data: Fall, right shoulder pain  RIGHT CLAVICLE - 2+ VIEWS  Comparison: None.  Findings: There is a comminuted distal right clavicular fracture. Right lung apex is grossly clear.  AC joint distance is grossly normal allowing for technique.  IMPRESSION: Comminuted distal right clavicular fracture.   Original Report Authenticated By: Christiana Pellant, M.D.    Dg Shoulder Right  06/27/2012  *RADIOLOGY REPORT*  Clinical Data: Fall with right shoulder pain.  RIGHT SHOULDER - 2+ VIEW  Comparison: Right clavicle 06/27/2012.  Findings: There is a mildly displaced fracture of the distal right clavicle.  Acromioclavicular joint appears grossly intact. Glenohumeral joint is unremarkable.  Visualized portion of the right chest shows right apical pleural thickening.  IMPRESSION: Distal right clavicle fracture.   Original Report Authenticated By: Leanna Battles, M.D.    Dg Hip Complete Right  06/27/2012  *RADIOLOGY REPORT*  Clinical Data: Fall  with right hip pain.  RIGHT HIP - COMPLETE 2+ VIEW  Comparison: 03/24/2009.  Findings: Dynamic hip screw and rod fixations are seen in the proximal femora bilaterally.  The screw on the right has retracted since the prior examination.  Marked secondary degenerative change in the left hip with flattening of the left femoral head.  The screw tip projects at the femoral cortex just beyond.  Degenerative changes seen in the spine.  No evidence of acute fracture.  IMPRESSION:  1.  No acute findings. 2.  Old right femur fracture repair with retracted dynamic hip screw. 3.  Old left femur fracture repair with  flattening of the left femoral head and progressive secondary degenerative changes in the left hip.  The dynamic hip screw tip projects at the cortex of the left femoral head or just beyond.   Original Report Authenticated By: Leanna Battles, M.D.    Dg Shoulder Right Port  06/28/2012  *RADIOLOGY REPORT*  Clinical Data: Fall  PORTABLE RIGHT SHOULDER - 2+ VIEW  Comparison: 06/27/2012  Findings: Stable right clavicle fracture.  Alignment is unchanged. No new fracture.  The coracoclavicular ligament insertion into the clavicle remains avulsed.  IMPRESSION: Stable clavicle fracture.   Original Report Authenticated By: Jolaine Click, M.D.     Review of Systems  Constitutional: Negative for fever and chills.  HENT: Positive for neck pain.   Respiratory: Negative for shortness of breath and wheezing.   Cardiovascular: Negative for chest pain.  Gastrointestinal: Negative for nausea, vomiting and abdominal pain.  Musculoskeletal:       R shoulder pain  Neurological: Negative for tingling and headaches.   Blood pressure 162/96, pulse 82, temperature 98.5 F (36.9 C), temperature source Oral, resp. rate 19, height 5\' 9"  (1.753 m), weight 67.5 kg (148 lb 13 oz), SpO2 98.00%. Physical Exam  Constitutional: He appears well-developed and well-nourished. He is cooperative. No distress. Cervical collar in place.  Musculoskeletal:       R upper Extremity    Ecchymosis and swelling to the R shoulder    TTP R distal clavicle    Axillary, Radial, median, ulnar nv sensation intact    R/U/M/Ax motor intact    nontender clavicle shaft and Wentworth joint     Distal R upper extremity unremarkable    Did have pt remove watch and place on contra-lateral side in the event he swells    Ext is warm    + radial pulse    Compartments soft and nontender   Neurological: He is alert.    Assessment/Plan:  79 y/o RHD male s/p fall  1. Fall 2. R distal clavicle fx  Non-op tx  Activity and ROM as tolerated  WBAT to  facilitate mobilization   Ice prn  Sling for comfort only  Follow up axillary view shows concentric joint.  No other issues noted 3. Continue per TS/NS 4. Dispo  PT/OT when ok with other services   No real restrictions with R distal clavicle fx, activity to tolerance  Will follow along  Pt does have a hx at [place] [facility], he can follow up there after d/c if he likes or can follow up with [place].   Mearl Latin, PA-C [facility] [phone] (P) 06/28/2012, 11:28 AM

## 2012-06-28 NOTE — Progress Notes (Signed)
Patient ID: [name] [name], male   DOB: 24-Mar-1922, 79 y.o.   MRN: [id number] BP 139/79  Pulse 89  Temp 98.8 F (37.1 C) (Axillary)  Resp 24  Ht 5\' 9"  (1.753 m)  Wt 67.5 kg (148 lb 13 oz)  BMI 21.98 kg/m2  SpO2 97% Head CT is unchanged. Mental status changes non anatomic. Perrl, symmetric facial movements Moving all extremities well Currently not confused, folllowing all commands Continue with collar for C2 fracture.

## 2012-06-28 NOTE — Progress Notes (Signed)
UR completed 

## 2012-06-28 NOTE — Clinical Social Work Psychosocial (Signed)
     Clinical Social Work Department BRIEF PSYCHOSOCIAL ASSESSMENT 06/28/2012  Patient:  [name] [name], [name] [name]     Account Number:  [id number]     Admit date:  06/27/2012  Clinical Social Worker:  Robin Searing  Date/Time:  06/28/2012 02:38 PM  Referred by:  RN  Date Referred:  06/28/2012 Referred for  Other - See comment   Other Referral:   ? NEEDS   Interview type:  Patient Other interview type:   Daughter at bedside-    PSYCHOSOCIAL DATA Living Status:  FAMILY Admitted from facility:   Level of care:   Primary support name:  DaughterElnita [name] [phone] Primary support relationship to patient:  FAMILY Degree of support available:   good  Has 24 hour caregivers at home per pt and daughter    CURRENT CONCERNS Current Concerns  Post-Acute Placement   Other Concerns:    SOCIAL WORK ASSESSMENT / PLAN Patient and his wife of 63 years have their home set up and accommodating to their needs. They also have 24 hour caregivers-  Discussed d/c possibilities with them- they are hopeful he will be able to go back home with wife and caregivers and do HH as needed or go to CIR/Rehab as he was there once before after a hip fx. The wife was recently placed in a SNF however they were not very pleased and do not wish to pursue that option for him at this time. Awaiting PT/OT recommendations.   Assessment/plan status:  Other - See comment Other assessment/ plan:   Information/referral to community resources:   SNF  [facility]    PATIENTS/FAMILYS RESPONSE TO PLAN OF CARE: Family open to considering CIR- I have made CIR RNCM aware of him and the above- will ask covering CSW to f/u for further disposition determination.

## 2012-06-28 NOTE — Progress Notes (Addendum)
Patient ID: [name] [name], male   DOB: Feb 28, 1922, 79 y.o.   MRN: [id number]    Subjective: Sore right shoulder and neck  Objective: Vital signs in last 24 hours: Temp:  [97.8 F (36.6 C)-98.5 F (36.9 C)] 98.5 F (36.9 C) (01/15 2306) Pulse Rate:  [66-103] 103  (01/16 0833) Resp:  [15-22] 22  (01/16 0833) BP: (116-155)/(61-88) 146/71 mmHg (01/16 0800) SpO2:  [88 %-100 %] 89 % (01/16 0833) Weight:  [67.5 kg (148 lb 13 oz)] 67.5 kg (148 lb 13 oz) (01/16 0000) Last BM Date: 06/27/12  Intake/Output from previous day: 01/15 0701 - 01/16 0700 In: 720 [P.O.:120; I.V.:600] Out: 915 [Urine:915] Intake/Output this shift: Total I/O In: -  Out: 255 [Urine:255]  General appearance: alert and cooperative Neck: collar on Resp: clear to auscultation bilaterally Cardio: irregularly irregular rhythm GI: soft, NT, ND, +BS Extremities: large contusion and tenderness right shoulder Neurologic: Mental status: Alert, oriented, thought content appropriate Motor: MAE, good strength except some limitation R shoulder due to acute pain and L hip due to chroinc issue  Lab Results: CBC   Basename 06/28/12 0340 06/27/12 1459  WBC 8.5 13.4*  HGB 11.3* 12.6*  HCT 33.6* 38.2*  PLT 140* 182   BMET  Basename 06/28/12 0340 06/27/12 1459  NA 139 139  K 4.4 4.2  CL 104 103  CO2 23 24  GLUCOSE 120* 118*  BUN 24* 29*  CREATININE 1.14 1.18  CALCIUM 8.8 9.1   PT/INR  Basename 06/28/12 0340 06/27/12 1459  LABPROT 16.9* 16.6*  INR 1.41 1.38   ABG No results found for this basename: PHART:2,PCO2:2,PO2:2,HCO3:2 in the last 72 hours  Studies/Results: Dg Clavicle Right  06/27/2012  *RADIOLOGY REPORT*  Clinical Data: Fall, right shoulder pain  RIGHT CLAVICLE - 2+ VIEWS  Comparison: None.  Findings: There is a comminuted distal right clavicular fracture. Right lung apex is grossly clear.  AC joint distance is grossly normal allowing for technique.  IMPRESSION: Comminuted distal right clavicular  fracture.   Original Report Authenticated By: Christiana Pellant, M.D.    Dg Shoulder Right  06/27/2012  *RADIOLOGY REPORT*  Clinical Data: Fall with right shoulder pain.  RIGHT SHOULDER - 2+ VIEW  Comparison: Right clavicle 06/27/2012.  Findings: There is a mildly displaced fracture of the distal right clavicle.  Acromioclavicular joint appears grossly intact. Glenohumeral joint is unremarkable.  Visualized portion of the right chest shows right apical pleural thickening.  IMPRESSION: Distal right clavicle fracture.   Original Report Authenticated By: Leanna Battles, M.D.    Dg Hip Complete Right  06/27/2012  *RADIOLOGY REPORT*  Clinical Data: Fall with right hip pain.  RIGHT HIP - COMPLETE 2+ VIEW  Comparison: 03/24/2009.  Findings: Dynamic hip screw and rod fixations are seen in the proximal femora bilaterally.  The screw on the right has retracted since the prior examination.  Marked secondary degenerative change in the left hip with flattening of the left femoral head.  The screw tip projects at the femoral cortex just beyond.  Degenerative changes seen in the spine.  No evidence of acute fracture.  IMPRESSION:  1.  No acute findings. 2.  Old right femur fracture repair with retracted dynamic hip screw. 3.  Old left femur fracture repair with flattening of the left femoral head and progressive secondary degenerative changes in the left hip.  The dynamic hip screw tip projects at the cortex of the left femoral head or just beyond.   Original Report Authenticated By: Leanna Battles, M.D.  Ct Head Wo Contrast  06/27/2012  *RADIOLOGY REPORT*  Clinical Data:  Basal cell carcinoma.  History of prior subdural hematoma.  Fall today with head and neck pain.  CT HEAD WITHOUT CONTRAST CT CERVICAL SPINE WITHOUT CONTRAST  Technique:  Multidetector CT imaging of the head and cervical spine was performed following the standard protocol without intravenous contrast.  Multiplanar CT image reconstructions of the cervical  spine were also generated.  Comparison:   None  CT HEAD  Findings: There is no evidence for mass lesion, midline shift, hydrocephalus or acute margin.  8 mm hyperdense focus identified in the posterior right sylvian fissure, suggesting a small focus of acute subarachnoid hemorrhage. Diffuse loss of parenchymal volume is consistent with atrophy. Patchy low attenuation in the deep hemispheric and periventricular white matter is nonspecific, but likely reflects chronic microvascular ischemic demyelination.  The visualized paranasal sinuses and mastoid air cells are clear. Bilateral frontal craniectomy defects are evident.  IMPRESSION: Small hemorrhagic focus in the posterior right sylvian fissure suggests a tiny focus of sub arachnoid hemorrhage.  Atrophy with chronic small vessel white matter disease.  CT CERVICAL SPINE  Findings: Imaging was obtained from the skull base through the T1-2 interspace. There is a subtle nondisplaced fracture involving the superior articular surface of the left C2 lateral mass.  This is associated with slight subluxation of the C1 lateral mass relative to C2 on the left.  No other acute fracture is identified.  There is mild anterolisthesis of C4 on five, compatible with the degree of facet osteoarthritis at the same level.  Similar changes are seen at C6- 7.  No evidence for prevertebral soft tissue swelling. Straightening of the normal cervical lordosis is evident.  IMPRESSION: Subtle nondisplaced fracture involving the superior articular surface of the left C2 lateral mass.  No other acute cervical spine fracture is evident.  Degenerative disc and facet disease.  Loss of cervical lordosis.  This can be related to patient positioning, muscle spasm or soft tissue injury.  I personally called the results of this exam to Dr. Deretha Emory in the emergency department at to 1708 hours on 06/27/2012.   Original Report Authenticated By: Kennith Center, M.D.    Ct Cervical Spine Wo  Contrast  06/27/2012  *RADIOLOGY REPORT*  Clinical Data:  Basal cell carcinoma.  History of prior subdural hematoma.  Fall today with head and neck pain.  CT HEAD WITHOUT CONTRAST CT CERVICAL SPINE WITHOUT CONTRAST  Technique:  Multidetector CT imaging of the head and cervical spine was performed following the standard protocol without intravenous contrast.  Multiplanar CT image reconstructions of the cervical spine were also generated.  Comparison:   None  CT HEAD  Findings: There is no evidence for mass lesion, midline shift, hydrocephalus or acute margin.  8 mm hyperdense focus identified in the posterior right sylvian fissure, suggesting a small focus of acute subarachnoid hemorrhage. Diffuse loss of parenchymal volume is consistent with atrophy. Patchy low attenuation in the deep hemispheric and periventricular white matter is nonspecific, but likely reflects chronic microvascular ischemic demyelination.  The visualized paranasal sinuses and mastoid air cells are clear. Bilateral frontal craniectomy defects are evident.  IMPRESSION: Small hemorrhagic focus in the posterior right sylvian fissure suggests a tiny focus of sub arachnoid hemorrhage.  Atrophy with chronic small vessel white matter disease.  CT CERVICAL SPINE  Findings: Imaging was obtained from the skull base through the T1-2 interspace. There is a subtle nondisplaced fracture involving the superior articular surface of the  left C2 lateral mass.  This is associated with slight subluxation of the C1 lateral mass relative to C2 on the left.  No other acute fracture is identified.  There is mild anterolisthesis of C4 on five, compatible with the degree of facet osteoarthritis at the same level.  Similar changes are seen at C6- 7.  No evidence for prevertebral soft tissue swelling. Straightening of the normal cervical lordosis is evident.  IMPRESSION: Subtle nondisplaced fracture involving the superior articular surface of the left C2 lateral mass.  No  other acute cervical spine fracture is evident.  Degenerative disc and facet disease.  Loss of cervical lordosis.  This can be related to patient positioning, muscle spasm or soft tissue injury.  I personally called the results of this exam to Dr. Deretha Emory in the emergency department at to 1708 hours on 06/27/2012.   Original Report Authenticated By: Kennith Center, M.D.     Anti-infectives: Anti-infectives    None      Assessment/Plan: Fall TBI/small SAH R sylvian fissure - TBI teams, hold coumadin, repeat CT only if MS changes per Dr. Franky Macho C2 lateral mass Fx - collar per Dr. Franky Macho A Fib - home dose cardiazem R distal clavicle Fx - shoulder films pending, per Dr. Carola Frost VTE - holding coumadin as above, PAS FEN - advance diet Continue 3100   LOS: 1 day    Violeta Gelinas, MD, MPH, FACS Pager: [phone]  06/28/2012

## 2012-06-28 NOTE — Consult Note (Signed)
I have seen and examined the patient. I agree with the findings including exam above. UEx shoulder, elbow, wrist, digits- no skin wounds, nontender, no instability, no blocks to motion  Sens  Ax/R/M/U intact  Mot   Ax/ R/ PIN/ M/ AIN/ U intact  Rad 2+  Ecchymosis of r shoulder  R clavicle fracture with increased risk of nonunion but impacted pattern bodes well WBAT, gentle motion as tolerated w PT/OT when able Sling for comfort Will follow  Marjory Meints H, MD 06/28/2012 12:06 PM

## 2012-06-29 LAB — CBC
HCT: 33.6 % — ABNORMAL LOW (ref 39.0–52.0)
Hemoglobin: 11.1 g/dL — ABNORMAL LOW (ref 13.0–17.0)
MCV: 94.6 fL (ref 78.0–100.0)
Platelets: 133 10*3/uL — ABNORMAL LOW (ref 150–400)
RBC: 3.55 MIL/uL — ABNORMAL LOW (ref 4.22–5.81)
WBC: 10.5 10*3/uL (ref 4.0–10.5)

## 2012-06-29 LAB — BASIC METABOLIC PANEL
CO2: 27 mEq/L (ref 19–32)
Chloride: 99 mEq/L (ref 96–112)
Creatinine, Ser: 1.23 mg/dL (ref 0.50–1.35)

## 2012-06-29 MED ORDER — SODIUM CHLORIDE 0.9 % IV SOLN
INTRAVENOUS | Status: DC
  Administered 2012-06-29: 09:00:00 via INTRAVENOUS
  Filled 2012-06-29: qty 1000

## 2012-06-29 NOTE — Progress Notes (Signed)
Patient ID: CHARBEL LOS, male   DOB: 26-Jul-1921, 79 y.o.   MRN: [id number] BP 128/62  Pulse 81  Temp 98 F (36.7 C) (Oral)  Resp 20  Ht 5\' 9"  (1.753 m)  Wt 67.5 kg (148 lb 13 oz)  BMI 21.98 kg/m2  SpO2 93% Alert and oriented to person place situation, time Moving all extremities Doing well neurologically Will followup in office. 37month
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
## 2012-06-29 NOTE — Progress Notes (Signed)
Rehab Admissions Coordinator Note:  Patient was screened by Clois Dupes for appropriateness for an Inpatient Acute Rehab Consult.  Discussed with [employer] medicare onsite CM. Insurance will not approve inpt rehab admission for this diagnosis. At this time, we are recommending Skilled Nursing Facility or home health as pt progresses.   Clois Dupes, RN 06/29/2012, 11:01 AM  I can be reached at [phone].
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
## 2012-06-29 NOTE — Progress Notes (Signed)
[facility]  Please call weekend coverage with questions or concerns, [phone].  See consult not for our recs.  Will reeval pt on Monday.    Mearl Latin, PA-C [facility] [phone] (P) 06/29/2012 10:33 AM

## 2012-06-29 NOTE — Progress Notes (Signed)
Patient ID: [name] [name], male   DOB: 01-27-1922, 78 y.o.   MRN: [id number]    Subjective: Rested well, no new complaints  Objective: Vital signs in last 24 hours: Temp:  [97.4 F (36.3 C)-98.8 F (37.1 C)] 97.4 F (36.3 C) (01/17 0351) Pulse Rate:  [26-120] 82  (01/17 0700) Resp:  [15-25] 17  (01/17 0700) BP: (106-171)/(55-126) 148/71 mmHg (01/17 0700) SpO2:  [77 %-100 %] 98 % (01/17 0700) Last BM Date: 06/27/12  Intake/Output from previous day: 01/16 0701 - 01/17 0700 In: 2275 [P.O.:1140; I.V.:1135] Out: 1690 [Urine:1690] Intake/Output this shift:    General appearance: alert and cooperative Neck: collar Resp: clear to auscultation bilaterally Chest wall: right upper chest and shoulder ecchymosis and tenderness Cardio: regular rate and rhythm Extremities: no LE edema Neurologic: Mental status: Alert, oriented, thought content appropriate Motor: MAE well, mild limitation RUE (acute with clavicle Fx), L hip (chronic)  Lab Results: CBC   Basename 06/29/12 0550 06/28/12 0340  WBC 10.5 8.5  HGB 11.1* 11.3*  HCT 33.6* 33.6*  PLT 133* 140*   BMET  Basename 06/29/12 0550 06/28/12 0340  NA 134* 139  K 4.9 4.4  CL 99 104  CO2 27 23  GLUCOSE 130* 120*  BUN 20 24*  CREATININE 1.23 1.14  CALCIUM 8.6 8.8   PT/INR  Basename 06/28/12 0340 06/27/12 1459  LABPROT 16.9* 16.6*  INR 1.41 1.38   ABG No results found for this basename: PHART:2,PCO2:2,PO2:2,HCO3:2 in the last 72 hours  Studies/Results: Dg Clavicle Right  06/27/2012  *RADIOLOGY REPORT*  Clinical Data: Fall, right shoulder pain  RIGHT CLAVICLE - 2+ VIEWS  Comparison: None.  Findings: There is a comminuted distal right clavicular fracture. Right lung apex is grossly clear.  AC joint distance is grossly normal allowing for technique.  IMPRESSION: Comminuted distal right clavicular fracture.   Original Report Authenticated By: Christiana Pellant, M.D.    Dg Shoulder Right  06/27/2012  *RADIOLOGY REPORT*   Clinical Data: Fall with right shoulder pain.  RIGHT SHOULDER - 2+ VIEW  Comparison: Right clavicle 06/27/2012.  Findings: There is a mildly displaced fracture of the distal right clavicle.  Acromioclavicular joint appears grossly intact. Glenohumeral joint is unremarkable.  Visualized portion of the right chest shows right apical pleural thickening.  IMPRESSION: Distal right clavicle fracture.   Original Report Authenticated By: Leanna Battles, M.D.    Dg Hip Complete Right  06/27/2012  *RADIOLOGY REPORT*  Clinical Data: Fall with right hip pain.  RIGHT HIP - COMPLETE 2+ VIEW  Comparison: 03/24/2009.  Findings: Dynamic hip screw and rod fixations are seen in the proximal femora bilaterally.  The screw on the right has retracted since the prior examination.  Marked secondary degenerative change in the left hip with flattening of the left femoral head.  The screw tip projects at the femoral cortex just beyond.  Degenerative changes seen in the spine.  No evidence of acute fracture.  IMPRESSION:  1.  No acute findings. 2.  Old right femur fracture repair with retracted dynamic hip screw. 3.  Old left femur fracture repair with flattening of the left femoral head and progressive secondary degenerative changes in the left hip.  The dynamic hip screw tip projects at the cortex of the left femoral head or just beyond.   Original Report Authenticated By: Leanna Battles, M.D.    Ct Head Wo Contrast  06/28/2012  *RADIOLOGY REPORT*  Clinical Data: Altered mental status.  Evaluate bleed.  CT HEAD WITHOUT CONTRAST  Technique:  Contiguous axial images were obtained from the base of the skull through the vertex without contrast.  Comparison: 06/27/2012  Findings: Small amount of subarachnoid blood again noted in the posterior right sylvian fissure, stable.  No new areas of hemorrhage or enlargement of the hemorrhage.  Atrophy and diffuse chronic small vessel disease throughout the deep white matter.  No hydrocephalus or  acute infarction.  No midline shift.  No acute calvarial abnormality. Prior bilateral frontal/temporal craniectomy.  IMPRESSION: Stable small right posterior sylvian fissure subarachnoid hemorrhage.  No new or enlarging hemorrhage.   Original Report Authenticated By: Charlett Nose, M.D.    Ct Head Wo Contrast  06/27/2012  *RADIOLOGY REPORT*  Clinical Data:  Basal cell carcinoma.  History of prior subdural hematoma.  Fall today with head and neck pain.  CT HEAD WITHOUT CONTRAST CT CERVICAL SPINE WITHOUT CONTRAST  Technique:  Multidetector CT imaging of the head and cervical spine was performed following the standard protocol without intravenous contrast.  Multiplanar CT image reconstructions of the cervical spine were also generated.  Comparison:   None  CT HEAD  Findings: There is no evidence for mass lesion, midline shift, hydrocephalus or acute margin.  8 mm hyperdense focus identified in the posterior right sylvian fissure, suggesting a small focus of acute subarachnoid hemorrhage. Diffuse loss of parenchymal volume is consistent with atrophy. Patchy low attenuation in the deep hemispheric and periventricular white matter is nonspecific, but likely reflects chronic microvascular ischemic demyelination.  The visualized paranasal sinuses and mastoid air cells are clear. Bilateral frontal craniectomy defects are evident.  IMPRESSION: Small hemorrhagic focus in the posterior right sylvian fissure suggests a tiny focus of sub arachnoid hemorrhage.  Atrophy with chronic small vessel white matter disease.  CT CERVICAL SPINE  Findings: Imaging was obtained from the skull base through the T1-2 interspace. There is a subtle nondisplaced fracture involving the superior articular surface of the left C2 lateral mass.  This is associated with slight subluxation of the C1 lateral mass relative to C2 on the left.  No other acute fracture is identified.  There is mild anterolisthesis of C4 on five, compatible with the degree of  facet osteoarthritis at the same level.  Similar changes are seen at C6- 7.  No evidence for prevertebral soft tissue swelling. Straightening of the normal cervical lordosis is evident.  IMPRESSION: Subtle nondisplaced fracture involving the superior articular surface of the left C2 lateral mass.  No other acute cervical spine fracture is evident.  Degenerative disc and facet disease.  Loss of cervical lordosis.  This can be related to patient positioning, muscle spasm or soft tissue injury.  I personally called the results of this exam to Dr. Deretha Emory in the emergency department at to 1708 hours on 06/27/2012.   Original Report Authenticated By: Kennith Center, M.D.    Ct Cervical Spine Wo Contrast  06/27/2012  *RADIOLOGY REPORT*  Clinical Data:  Basal cell carcinoma.  History of prior subdural hematoma.  Fall today with head and neck pain.  CT HEAD WITHOUT CONTRAST CT CERVICAL SPINE WITHOUT CONTRAST  Technique:  Multidetector CT imaging of the head and cervical spine was performed following the standard protocol without intravenous contrast.  Multiplanar CT image reconstructions of the cervical spine were also generated.  Comparison:   None  CT HEAD  Findings: There is no evidence for mass lesion, midline shift, hydrocephalus or acute margin.  8 mm hyperdense focus identified in the posterior right sylvian fissure, suggesting a small focus  of acute subarachnoid hemorrhage. Diffuse loss of parenchymal volume is consistent with atrophy. Patchy low attenuation in the deep hemispheric and periventricular white matter is nonspecific, but likely reflects chronic microvascular ischemic demyelination.  The visualized paranasal sinuses and mastoid air cells are clear. Bilateral frontal craniectomy defects are evident.  IMPRESSION: Small hemorrhagic focus in the posterior right sylvian fissure suggests a tiny focus of sub arachnoid hemorrhage.  Atrophy with chronic small vessel white matter disease.  CT CERVICAL SPINE   Findings: Imaging was obtained from the skull base through the T1-2 interspace. There is a subtle nondisplaced fracture involving the superior articular surface of the left C2 lateral mass.  This is associated with slight subluxation of the C1 lateral mass relative to C2 on the left.  No other acute fracture is identified.  There is mild anterolisthesis of C4 on five, compatible with the degree of facet osteoarthritis at the same level.  Similar changes are seen at C6- 7.  No evidence for prevertebral soft tissue swelling. Straightening of the normal cervical lordosis is evident.  IMPRESSION: Subtle nondisplaced fracture involving the superior articular surface of the left C2 lateral mass.  No other acute cervical spine fracture is evident.  Degenerative disc and facet disease.  Loss of cervical lordosis.  This can be related to patient positioning, muscle spasm or soft tissue injury.  I personally called the results of this exam to Dr. Deretha Emory in the emergency department at to 1708 hours on 06/27/2012.   Original Report Authenticated By: Kennith Center, M.D.    Dg Shoulder Right Port  06/28/2012  *RADIOLOGY REPORT*  Clinical Data: Fall  PORTABLE RIGHT SHOULDER - 2+ VIEW  Comparison: 06/27/2012  Findings: Stable right clavicle fracture.  Alignment is unchanged. No new fracture.  The coracoclavicular ligament insertion into the clavicle remains avulsed.  IMPRESSION: Stable clavicle fracture.   Original Report Authenticated By: Jolaine Click, M.D.     Anti-infectives: Anti-infectives    None      Assessment/Plan: Fall TBI/small SAH R sylvian fissure - F/U CT yesterday evening stable, TBI teams, holding coumadin C2 lateral mass Fx - collar per Dr. Franky Macho A Fib - home dose cardiazem, occasional PVCs on tele R distal clavicle Fx - WBAT and sling for comfort per Dr. Carola Frost VTE - holding coumadin as above, PAS FEN - tolerating diet, remove K from IVF (4.9) Transfer to 4N tele  LOS: 2 days    Violeta Gelinas, MD, MPH, FACS Pager: [phone]  06/29/2012

## 2012-06-29 NOTE — Evaluation (Signed)
Physical Therapy Evaluation Patient Details Name: [name] [name] MRN: [id number] DOB: [birth year] Today's Date: 06/29/2012 Time: 4098-1191 PT Time Calculation (min): 25 min  PT Assessment / Plan / Recommendation Clinical Impression  79 yo adm s/p fall down flight of stairs with Rt clavicle fx (WBAT), C2 fx, and small Rt cerebral contusion (sylvian fissure). Pt using RUE well and should be able to resume use of his rolling walker. Will benefit from PT to incr safety and mobility prior to return home with 24 hr care.    PT Assessment  Patient needs continued PT services    Follow Up Recommendations  Supervision/Assistance - 24 hour;CIR    Does the patient have the potential to tolerate intense rehabilitation    yes  Barriers to Discharge Inaccessible home environment (currently one flight down to enter home)      Equipment Recommendations  None recommended by PT    Recommendations for Other Services Rehab consult   Frequency Min 5X/week    Precautions / Restrictions Precautions Precautions: Fall;Cervical Precaution Comments: blue arm sling for comfort to Rt UE, WBAT Required Braces or Orthoses: Cervical Brace Cervical Brace: Hard collar;Other (comment) (at all times) Restrictions Weight Bearing Restrictions: No   Pertinent Vitals/Pain 5/10 Rt flank to hip pain; repositioned for comfort      Mobility  Bed Mobility Bed Mobility: Supine to Sit;Sitting - Scoot to Edge of Bed Supine to Sit: 3: Mod assist;HOB elevated Sitting - Scoot to [place] of Bed: 4: Min assist Details for Bed Mobility Assistance: Pt crooked in bed and easiest to come to EOB with HOB elevated due to starting position; mod assist to elevate trunk to sitting; incr time and effort to scoot to EOB,using RUE for WB well Transfers Transfers: Sit to Stand;Stand to Sit;Stand Pivot Transfers Sit to Stand: 3: Mod assist;With upper extremity assist;With armrests;From bed Stand to Sit: 3: Mod assist;With armrests;To  chair/3-in-1 Stand Pivot Transfers: 3: Mod assist;With armrests Details for Transfer Assistance: Pt used armrest of chair (positioned close to bed) to push up on to come to stand; pt remained flexed through upper trunk while standing (reported this is not his usual posture, but too uncomfortable to stand fully upright) Ambulation/Gait Ambulation/Gait Assistance: Not tested (comment)              PT Diagnosis: Difficulty walking;Acute pain  PT Problem List: Decreased strength;Decreased activity tolerance;Decreased balance;Decreased mobility;Decreased knowledge of use of DME;Decreased safety awareness;Pain PT Treatment Interventions: DME instruction;Gait training;Stair training;Functional mobility training;Therapeutic activities;Therapeutic exercise;Patient/family education   PT Goals Acute Rehab PT Goals PT Goal Formulation: With patient Time For Goal Achievement: 07/06/12 Potential to Achieve Goals: Good Pt will Roll Supine to Left Side: with supervision PT Goal: Rolling Supine to Left Side - Progress: Goal set today Pt will go Supine/Side to Sit: with supervision;with HOB 0 degrees PT Goal: Supine/Side to Sit - Progress: Goal set today Pt will go Sit to Supine/Side: with supervision;with HOB 0 degrees PT Goal: Sit to Supine/Side - Progress: Goal set today Pt will go Sit to Stand: with supervision;with upper extremity assist PT Goal: Sit to Stand - Progress: Goal set today Pt will go Stand to Sit: with supervision;with upper extremity assist PT Goal: Stand to Sit - Progress: Goal set today Pt will Ambulate: 51 - 150 feet;with rolling walker;Other (comment) (minguard) PT Goal: Ambulate - Progress: Goal set today Pt will Go Up / Down Stairs: 6-9 stairs;with min assist;with rail(s);with least restrictive assistive device PT Goal: Up/Down Stairs - Progress: Goal  set today  Visit Information  Last PT Received On: 06/29/12 Assistance Needed: +2 PT/OT Co-Evaluation/Treatment: Yes      Subjective Data  Subjective: Pt reports he was holding groceries, both handles of RW, and placing back legs of RW on next step down as descending steps. He was about 4 steps down when he turned to close the door at top of the steps, lost his balance and fell. Patient Stated Goal: return home when more mobile   Prior Functioning  Home Living Lives With: Spouse (has caregiver) Available Help at Discharge: Personal care attendant;Available 24 hours/day Type of Home: House Home Access: Level entry Home Layout: Two level (lives in the basement ) Bathroom Shower/Tub: Walk-in shower;Door Bathroom Toilet: Standard (3n1 over toilet) Home Adaptive Equipment: Bedside commode/3-in-1;Walker - rolling;Grab bars in shower;Grab bars around toilet;Wheelchair - [employer] - four wheeled (x2 rw and Garment/textile technologist) Additional Comments: level entry to basement not accessible currently due to septic issue and "yard torn up." Pt having to park in garage and go down interior stairs to basement. Prior Function Level of Independence: Needs assistance;Independent with assistive device(s) Needs Assistance: Light Housekeeping;Meal Prep Toileting: Other (comment) (independent) Meal Prep: Total Light Housekeeping: Total Able to Take Stairs?: No Driving: Yes Vocation: Retired Comments: Pt uses his 4 wheeled walker inside the home and uses his 2 wheel RW when going out (easier to transport in car) Communication Communication: No difficulties Dominant Hand: Right    Cognition  Overall Cognitive Status: Appears within functional limits for tasks assessed/performed Arousal/Alertness: Awake/alert Orientation Level: Appears intact for tasks assessed Behavior During Session: [facility] for tasks performed    Extremity/Trunk Assessment Right Lower Extremity Assessment RLE ROM/Strength/Tone: Deficits;Due to pain RLE ROM/Strength/Tone Deficits: reports pain RLE due to fall (xrays negative); AROM WFL; grossly 4/5 RLE  Coordination: WFL - gross motor  Left Lower Extremity Assessment LLE ROM/Strength/Tone: Deficits LLE ROM/Strength/Tone Deficits: AROM WFL; grossly 4/5 LLE Coordination: WFL - gross motor Trunk Assessment Trunk Assessment: Kyphotic;Other exceptions (pain Rt flank from fall)   Balance Balance Balance Assessed: Yes Static Sitting Balance Static Sitting - Balance Support: Bilateral upper extremity supported;Feet supported Static Sitting - Level of Assistance: 5: Stand by assistance Static Standing Balance Static Standing - Balance Support: Bilateral upper extremity supported Static Standing - Level of Assistance: 1: +2 Total assist;Patient percentage (comment) (pt=70%) Static Standing - Comment/# of Minutes: kyphotic and flexed at hips  End of Session PT - End of Session Equipment Utilized During Treatment: Gait belt;Cervical collar Activity Tolerance: Patient limited by pain Patient left: in chair;with call bell/phone within reach;with nursing in room Nurse Communication: Mobility status;Other (comment) (doing well on RA & RN OK with leaving on RA)  GP     Fredrico Beedle 06/29/2012, 9:56 AM  Pager [phone]

## 2012-06-29 NOTE — Plan of Care (Signed)
Problem: Phase II Progression Outcomes Goal: Vital signs remain stable Outcome: Progressing Pt on RA 90-94% during OT/ PT evaluation.

## 2012-06-29 NOTE — Clinical Social Work Placement (Signed)
     Clinical Social Work Department CLINICAL SOCIAL WORK PLACEMENT NOTE 06/29/2012  Patient:  [name] [name], [name] [name]  Account Number:  [id number] Admit date:  06/27/2012  Clinical Social Worker:  Margaree Mackintosh  Date/time:  06/29/2012 03:13 PM  Clinical Social Work is seeking post-discharge placement for this patient at the following level of care:   SKILLED NURSING   (*CSW will update this form in Epic as items are completed)   06/29/2012  Patient/family provided with [facility] Department of Clinical Social Works list of facilities offering this level of care within the geographic area requested by the patient (or if unable, by the patients family).  06/29/2012  Patient/family informed of their freedom to choose among providers that offer the needed level of care, that participate in Medicare, Medicaid or managed care program needed by the patient, have an available bed and are willing to accept the patient.  06/29/2012  Patient/family informed of [facility] ownership interest in [facility], as well as of the fact that they are under no obligation to receive care at this facility.  PASARR submitted to [employer]DS on 06/29/2012 PASARR number received from [employer] on   FL2 transmitted to all facilities in geographic area requested by pt/family on  06/29/2012 FL2 transmitted to all facilities within larger geographic area on   Patient informed that his/her managed care company has contracts with or will negotiate with  certain facilities, including the following:     Patient/family informed of bed offers received:   Patient chooses bed at  Physician recommends and patient chooses bed at    Patient to be transferred to  on   Patient to be transferred to facility by   The following physician request were entered in Epic:   Additional Comments:

## 2012-06-30 LAB — BASIC METABOLIC PANEL
CO2: 20 mEq/L (ref 19–32)
Calcium: 8.7 mg/dL (ref 8.4–10.5)
Glucose, Bld: 130 mg/dL — ABNORMAL HIGH (ref 70–99)
Sodium: 131 mEq/L — ABNORMAL LOW (ref 135–145)

## 2012-06-30 NOTE — Progress Notes (Signed)
Physical Therapy Treatment Patient Details Name: [name] [name] MRN: [id number] DOB: 1921/07/26 Today's Date: 06/30/2012 Time: 4098-1191 PT Time Calculation (min): 21 min  PT Assessment / Plan / Recommendation Comments on Treatment Session  Pt progressing well, able to ambulate this session. Pt still in kyphotic posture with hips flexed, cues throughout to maintain upright postiion. Continue per plan    Follow Up Recommendations  Supervision/Assistance - 24 hour;CIR     Does the patient have the potential to tolerate intense rehabilitation     Barriers to Discharge        Equipment Recommendations  None recommended by PT    Recommendations for Other Services Rehab consult  Frequency Min 5X/week   Plan Discharge plan remains appropriate;Frequency remains appropriate    Precautions / Restrictions Precautions Precautions: Fall;Cervical Required Braces or Orthoses: Cervical Brace Cervical Brace: Hard collar Restrictions Weight Bearing Restrictions: No   Pertinent Vitals/Pain Pain in RUE with weightbearing.    Mobility  Bed Mobility Bed Mobility: Supine to Sit;Sitting - Scoot to Edge of Bed Supine to Sit: 3: Mod assist;HOB elevated Sitting - Scoot to Edge of Bed: 4: Min assist Details for Bed Mobility Assistance: Assist through trunk into sitting. Cues for safe technique to avoid neck flexion during sitting. Pt able to bring LEs over, although unable to push up with RUE secondary to pain Transfers Transfers: Sit to Stand;Stand to Sit Sit to Stand: 3: Mod assist;With upper extremity assist;With armrests;From bed Stand to Sit: 3: Mod assist;With armrests;To chair/3-in-1 Details for Transfer Assistance: VC for hand placement, pt with difficulty using RUE to push up. Increased assistance needed for upright posture with hips extended Ambulation/Gait Ambulation/Gait Assistance: 3: Mod assist Ambulation Distance (Feet): 25 Feet Assistive device: Rolling walker Ambulation/Gait  Assistance Details: Mod assist with support for stability during ambulation. Cues for even step length and distance to RW for safety.  Gait Pattern: Step-to pattern;Decreased step length - right;Decreased hip/knee flexion - right;Decreased hip/knee flexion - left;Trunk flexed;Narrow base of support Gait velocity: slow    Exercises     PT Diagnosis:    PT Problem List:   PT Treatment Interventions:     PT Goals Acute Rehab PT Goals PT Goal: Rolling Supine to Left Side - Progress: Progressing toward goal PT Goal: Supine/Side to Sit - Progress: Progressing toward goal PT Goal: Sit to Supine/Side - Progress: Progressing toward goal PT Goal: Sit to Stand - Progress: Progressing toward goal PT Goal: Stand to Sit - Progress: Progressing toward goal PT Goal: Ambulate - Progress: Progressing toward goal  Visit Information  Last PT Received On: 06/30/12 Assistance Needed: +2 (chair follow)    Subjective Data      Cognition  Overall Cognitive Status: Appears within functional limits for tasks assessed/performed Arousal/Alertness: Awake/alert Orientation Level: Appears intact for tasks assessed Behavior During Session: [facility] for tasks performed    Balance     End of Session PT - End of Session Equipment Utilized During Treatment: Gait belt;Cervical collar Activity Tolerance: Patient tolerated treatment well Patient left: in chair;with call bell/phone within reach;with nursing in room Nurse Communication: Mobility status   GP     Milana Kidney 06/30/2012, 2:30 PM

## 2012-06-30 NOTE — Progress Notes (Signed)
Patient ID: [name] [name], male   DOB: 09-05-1921, 79 y.o.   MRN: [id number] Patient ID: [name] [name], male   DOB: 1921/11/05, 79 y.o.   MRN: [id number]    Subjective: Complains of soreness right shoulder and arm  Objective: Vital signs in last 24 hours: Temp:  [97.6 F (36.4 C)-98.4 F (36.9 C)] 97.6 F (36.4 C) (01/18 0700) Pulse Rate:  [75-97] 92  (01/18 0700) Resp:  [16-23] 18  (01/18 0700) BP: (105-138)/(49-77) 133/77 mmHg (01/18 0700) SpO2:  [92 %-94 %] 92 % (01/18 0700) Last BM Date: 06/27/12  Intake/Output from previous day: 01/17 0701 - 01/18 0700 In: 110 [I.V.:110] Out: 775 [Urine:775] Intake/Output this shift:    General appearance: alert and cooperative Neck: collar Resp: clear to auscultation bilaterally Chest wall: right upper chest and shoulder ecchymosis and tenderness Cardio: regular rate and rhythm Extremities: no LE edema Neurologic: Mental status: Alert, oriented, thought content appropriate Motor: MAE well, mild limitation RUE (acute with clavicle Fx), L hip (chronic)  Lab Results: CBC   Basename 06/29/12 0550 06/28/12 0340  WBC 10.5 8.5  HGB 11.1* 11.3*  HCT 33.6* 33.6*  PLT 133* 140*   BMET  Basename 06/30/12 0630 06/29/12 0550  NA 131* 134*  K 4.1 4.9  CL 95* 99  CO2 20 27  GLUCOSE 130* 130*  BUN 25* 20  CREATININE 1.19 1.23  CALCIUM 8.7 8.6   PT/INR  Basename 06/28/12 0340 06/27/12 1459  LABPROT 16.9* 16.6*  INR 1.41 1.38   ABG No results found for this basename: PHART:2,PCO2:2,PO2:2,HCO3:2 in the last 72 hours  Studies/Results: Ct Head Wo Contrast  06/28/2012  *RADIOLOGY REPORT*  Clinical Data: Altered mental status.  Evaluate bleed.  CT HEAD WITHOUT CONTRAST  Technique:  Contiguous axial images were obtained from the base of the skull through the vertex without contrast.  Comparison: 06/27/2012  Findings: Small amount of subarachnoid blood again noted in the posterior right sylvian fissure, stable.  No new areas of  hemorrhage or enlargement of the hemorrhage.  Atrophy and diffuse chronic small vessel disease throughout the deep white matter.  No hydrocephalus or acute infarction.  No midline shift.  No acute calvarial abnormality. Prior bilateral frontal/temporal craniectomy.  IMPRESSION: Stable small right posterior sylvian fissure subarachnoid hemorrhage.  No new or enlarging hemorrhage.   Original Report Authenticated By: Charlett Nose, M.D.     Anti-infectives: Anti-infectives    None      Assessment/Plan: Fall TBI/small SAH R sylvian fissure - F/U CT  stable, TBI teams, holding coumadin C2 lateral mass Fx - collar per Dr. Franky Macho A Fib - home dose cardiazem, occasional PVCs on tele R distal clavicle Fx - WBAT and sling for comfort per Dr. Carola Frost VTE - holding coumadin as above, PAS FEN - tolerating diet, monitor Na PT  LOS: 3 days     06/30/2012

## 2012-07-01 MED ORDER — HYDROCODONE-ACETAMINOPHEN 5-325 MG PO TABS: 1.0000 | ORAL_TABLET | ORAL | Status: DC | PRN

## 2012-07-01 NOTE — Progress Notes (Signed)
  Subjective: Pt with no complaints this AM.  Tol PO well.  Objective: Vital signs in last 24 hours: Temp:  [97.8 F (36.6 C)-98.6 F (37 C)] 98.4 F (36.9 C) (01/19 0444) Pulse Rate:  [74-101] 74  (01/19 0444) Resp:  [17-20] 18  (01/19 0444) BP: (122-135)/(53-88) 129/88 mmHg (01/19 0444) SpO2:  [92 %-100 %] 100 % (01/19 0444) Last BM Date: 06/27/12  Intake/Output from previous day:   07/01/2012    General appearance: alert and cooperative Neck: collar Resp: clear to auscultation bilaterally Chest wall: right upper chest and shoulder ecchymosis and tenderness Cardio: regular rate and rhythm Extremities: no LE edema Neurologic: Mental status: Alert, oriented, thought content appropriate Motor: MAE well, mild limitation RUE (acute with clavicle Fx), L hip (chronic)   Lab Results:   [facility] 06/29/12 0550  WBC 10.5  HGB 11.1*  HCT 33.6*  PLT 133*   BMET  Basename 06/30/12 0630 06/29/12 0550  NA 131* 134*  K 4.1 4.9  CL 95* 99  CO2 20 27  GLUCOSE 130* 130*  BUN 25* 20  CREATININE 1.19 1.23  CALCIUM 8.7 8.6   PT/INR No results found for this basename: LABPROT:2,INR:2 in the last 72 hours ABG No results found for this basename: PHART:2,PCO2:2,PO2:2,HCO3:2 in the last 72 hours  Studies/Results: No results found.  Anti-infectives: Anti-infectives    None      Assessment/Plan: s/p * No surgery found * Fall TBI/small SAH R sylvian fissure - F/U CT  stable, TBI teams, holding coumadin C2 lateral mass Fx - collar per Dr. Franky Macho A Fib - home dose cardiazem, occasional PVCs on tele R distal clavicle Fx - WBAT and sling for comfort per Dr. Carola Frost VTE - holding coumadin as above, PAS FEN - tolerating diet, monitor Na PT    LOS: 4 days    Marigene Ehlers., [facility] 07/01/2012

## 2012-07-01 NOTE — Progress Notes (Signed)
Pt's daughter expressed concerns about father being increasingly confused and agitated. Thought it may be secondary to morphine administered and requesting Pt be on hydrocodone as per his home regimen for pain management. Dr Derrell Lolling paged and made aware. Order received.

## 2012-07-01 NOTE — Progress Notes (Signed)
Patient seems to be more confused than last evening. Patient can still answer orientation questions with the exception of time, but has attempted to get out of bed a few times to "check on the cats," "turn out the light" (no light was on), and "move the bed out of the way because it's stuck." Patient repositioned, and checked for pain each time. Is currently resting comfortably.

## 2012-07-02 ENCOUNTER — Inpatient Hospital Stay (HOSPITAL_COMMUNITY): Payer: Medicare Other

## 2012-07-02 DIAGNOSIS — S12100A Unspecified displaced fracture of second cervical vertebra, initial encounter for closed fracture: Secondary | ICD-10-CM | POA: Diagnosis present

## 2012-07-02 DIAGNOSIS — S42001A Fracture of unspecified part of right clavicle, initial encounter for closed fracture: Secondary | ICD-10-CM | POA: Diagnosis present

## 2012-07-02 DIAGNOSIS — W19XXXA Unspecified fall, initial encounter: Secondary | ICD-10-CM | POA: Diagnosis present

## 2012-07-02 DIAGNOSIS — Z7901 Long term (current) use of anticoagulants: Secondary | ICD-10-CM

## 2012-07-02 DIAGNOSIS — S066XAA Traumatic subarachnoid hemorrhage with loss of consciousness status unknown, initial encounter: Secondary | ICD-10-CM | POA: Diagnosis present

## 2012-07-02 DIAGNOSIS — S066X9A Traumatic subarachnoid hemorrhage with loss of consciousness of unspecified duration, initial encounter: Secondary | ICD-10-CM | POA: Diagnosis present

## 2012-07-02 MED ORDER — HYDROCODONE-ACETAMINOPHEN 5-325 MG PO TABS
0.5000 | ORAL_TABLET | ORAL | Status: DC | PRN
  Administered 2012-07-02: 1 via ORAL
  Filled 2012-07-02: qty 1

## 2012-07-02 MED ORDER — HYDROCODONE-ACETAMINOPHEN 5-325 MG PO TABS: 0.5000 | ORAL_TABLET | ORAL | Status: DC | PRN

## 2012-07-02 NOTE — Progress Notes (Signed)
Physical Therapy Treatment Patient Details Name: [name] [name] MRN: [id number] DOB: 1922-05-30 Today's Date: 07/02/2012 Time: 4098-1191 PT Time Calculation (min): 24 min  PT Assessment / Plan / Recommendation Comments on Treatment Session  Pt reports UE pain/discomfort makes mobility difficult.  Pt with minimal use of UE's during supine>sit & sit<>stand transfers but was able to tolerate using RW for ambulation.  Trauma MD arriving to pt's room at beginning of session to assess mobility.  Pt still requires fair amount of (A) but has 24 hr care at home.  Pt also reports he has a w/c at home- encouraged use of w/c for increased distances at this time.   Spoke with pt's daughter, Elnita Maxwell, via phone at end of session & discussed pt possibly d/cing home today per MD.  Daughter is interested in hospital bed for pt.  MD notified.      Follow Up Recommendations  Supervision/Assistance - 24 hour;CIR     Does the patient have the potential to tolerate intense rehabilitation     Barriers to Discharge        Equipment Recommendations  Hospital bed    Recommendations for Other Services    Frequency Min 5X/week   Plan Discharge plan remains appropriate;Frequency remains appropriate    Precautions / Restrictions Precautions Precautions: Fall;Cervical Required Braces or Orthoses: Cervical Brace Cervical Brace: Hard collar Restrictions Weight Bearing Restrictions: No   Pertinent Vitals/Pain C/o pain in bil UE's.  Did not rate but pt with minimal use of UE's to assist with various tasks.      Mobility  Bed Mobility Bed Mobility: Left Sidelying to Sit;Sitting - Scoot to Edge of Bed Left Sidelying to Sit: 3: Mod assist;HOB flat Details for Bed Mobility Assistance: Cues for rolling to decrease discomfort of cervical.  (A) to lift shoulders/trunk to sitting upright & postural control while coming to EOB.  Pt with difficulty using UE's due to pain.   Transfers Transfers: Sit to Stand;Stand to  Sit Sit to Stand: 3: Mod assist;With upper extremity assist;From bed Stand to Sit: With upper extremity assist;3: Mod assist;With armrests;To chair/3-in-1 Details for Transfer Assistance: Cues for hand placement & technique.  (A) to achieve standing, anterior translation, balance, & controlled descent.  Pt minimal use of UE's for stand>sit  Ambulation/Gait Ambulation/Gait Assistance: 3: Mod assist Ambulation Distance (Feet): 50 Feet Assistive device: Rolling walker Ambulation/Gait Assistance Details: (A) for balance/stability.  Cues for safe RW advancement & tall posture.   Gait Pattern: Decreased stride length;Trunk flexed;Narrow base of support (decreased floor clearance) Gait velocity: slow      PT Goals Acute Rehab PT Goals Time For Goal Achievement: 07/06/12 Potential to Achieve Goals: Good Pt will Roll Supine to Left Side: with supervision PT Goal: Rolling Supine to Left Side - Progress: Not met Pt will go Supine/Side to Sit: with supervision;with HOB 0 degrees PT Goal: Supine/Side to Sit - Progress: Not met Pt will go Sit to Supine/Side: with supervision;with HOB 0 degrees Pt will go Sit to Stand: with supervision;with upper extremity assist PT Goal: Sit to Stand - Progress: Not met Pt will go Stand to Sit: with supervision;with upper extremity assist PT Goal: Stand to Sit - Progress: Not met Pt will Ambulate: 51 - 150 feet;with rolling walker;Other (comment) PT Goal: Ambulate - Progress: Progressing toward goal Pt will Go Up / Down Stairs: 6-9 stairs;with min assist;with rail(s);with least restrictive assistive device  Visit Information  Last PT Received On: 07/02/12 Assistance Needed: +2 (follow with chair)  Subjective Data      Cognition  Overall Cognitive Status: Appears within functional limits for tasks assessed/performed Arousal/Alertness: Awake/alert Orientation Level: Appears intact for tasks assessed Behavior During Session: [facility] for tasks performed     Balance  Static Sitting Balance Static Sitting - Balance Support: Feet supported;Bilateral upper extremity supported;Left upper extremity supported Static Sitting - Level of Assistance: 5: Stand by assistance;4: Min assist Static Sitting - Comment/# of Minutes: Pt required min (A) for postural control initially due to posterior lean.  Cues for use of truncal muscles to sit upright.   Static Standing Balance Static Standing - Balance Support: Bilateral upper extremity supported Static Standing - Level of Assistance: 3: Mod assist Static Standing - Comment/# of Minutes: (A) for anterior translation of trunk over BOS.  Pt still with kyphotic posture.    End of Session PT - End of Session Equipment Utilized During Treatment: Gait belt;Cervical collar Activity Tolerance: Patient tolerated treatment well;Patient limited by pain Patient left: in chair;with call bell/phone within reach Nurse Communication: Mobility status    Verdell Face, [place] [phone] 07/02/2012

## 2012-07-02 NOTE — Progress Notes (Addendum)
Up with therapies and we discussed his progress. Plan home later today.  He has a walker and a wheelchair at home. Patient examined and I agree with the assessment and plan  Violeta Gelinas, MD, MPH, FACS Pager: [phone]  07/02/2012 10:11 AM

## 2012-07-02 NOTE — Discharge Summary (Signed)
Physician Discharge Summary  Patient ID: [name] [name] MRN: [id number] DOB/AGE: 77/16/1923 77 y.o.  Admit date: 06/27/2012 Discharge date: 07/02/2012  Discharge Diagnoses Patient Active Problem List   Diagnosis Date Noted  . Fall 07/02/2012  . C2 cervical fracture 07/02/2012  . Right clavicle fracture 07/02/2012  . Traumatic subarachnoid hemorrhage 07/02/2012  . Anticoagulated 07/02/2012  . Edema of both legs 01/17/2012  . CARCINOMA, BASAL CELL 12/23/2008  . MACULAR DEGENERATION 12/23/2008  . HYPERTENSION, UNSPECIFIED 12/23/2008  . Atrial fibrillation 12/23/2008  . CAROTID ARTERY STENOSIS 12/23/2008  . POLYMYALGIA RHEUMATICA 12/23/2008  . PSA, INCREASED 12/23/2008    Consultants Dr. Coletta Memos for neurosurgery  Dr. Daneil Dolin for orthopedic surgery   Procedures None   HPI: [name] [name] presents after a fall down several stairs. The patient ambulates with a walker and was coming down the stairs, carrying some groceries and using his walker, when he fell. Apparently he landed on his right side. There was a loss of consciousness. He was complaining of pain in his right shoulder and right hip, but the right hip pain is chronic. He was evaluated at [facility] and diagnosed with the traumatic brain injury, cervical spine fracture, and clavicle fracture. He was transferred to [facility] after discussion with Neurosurgery.   Hospital Course: Neurosurgery determined no surgery was needed for the patient's cervical spine fracture. His brain injury was followed clinically. Fortunately the patient was subtherapeutic on his coumadin at the time of his fall. No active interventions were necessary to reverse the effects of the coumadin. Orthopedic surgery decided on a non-operative course of treatment for his clavicle fracture as well. He was mobilized with physical and occupational therapies and needed significant amounts of help. He already has 24-hour assistance at home because his wife  needs around-the-clock care. Aside from developing some confusion with morphine administration he did not suffer any complications while hospitalized. He was discharged home in stable condition.  We have recommended a follow-up appointment with his PCP in order to discuss resumption of his coumadin (usually safe at 1 month following the brain injury) but, given his frailty and risk of falling, the risk/benefit ratio of restarting should be addressed carefully.      Medication List     As of 07/02/2012  1:35 PM    STOP taking these medications         HYDROcodone-acetaminophen 5-500 MG per tablet   Commonly known as: VICODIN      warfarin 4 MG tablet   Commonly known as: COUMADIN      TAKE these medications         benazepril 10 MG tablet   Commonly known as: LOTENSIN   Take 10 mg by mouth every morning.      calcium-vitamin D 500-200 MG-UNIT per tablet   Commonly known as: OSCAL WITH D   Take 1 tablet by mouth 2 (two) times daily.      diltiazem 240 MG 24 hr capsule   Commonly known as: CARDIZEM CD   Take 240 mg by mouth every morning.      furosemide 20 MG tablet   Commonly known as: LASIX   Take 1 tablet (20 mg total) by mouth daily.      HYDROcodone-acetaminophen 5-325 MG per tablet   Commonly known as: NORCO/VICODIN   Take 0.5-2 tablets by mouth every 4 (four) hours as needed (1/2 tab for mild pain, 1 tab for moderate pain, 2 tabs for severe pain).  multivitamin with minerals tablet   Take 1 tablet by mouth every morning.      REFRESH 1.4-0.6 % ophthalmic solution   Generic drug: polyvinyl alcohol-povidone   Place 1-2 drops into both eyes every 4 (four) hours.      vitamin B-12 1000 MCG tablet   Commonly known as: CYANOCOBALAMIN   Take 1,000 mcg by mouth every morning.             Follow-up Information    Follow up with CABBELL,KYLE L, MD. In 1 month. (Cspine xrays on day of appt)    Contact information:   [address]                          [address] [place] [address] [phone]       Schedule an appointment as soon as possible for a visit with Budd Palmer, MD.   Contact information:   [address] [place] [address] [phone]       Follow up with VYAS,DHRUV B., MD. Schedule an appointment as soon as possible for a visit in 2 weeks.   Contact information:   [address] [place] [address] [phone]       Call [facility]. (As needed)    Contact information:   [address] [place] [address] [phone]          Discharge planning took greater than 30 minutes.   Signed: Freeman Caldron, PA-C Pager: [phone] General Trauma PA Pager: [phone]    07/02/2012, 1:35 PM

## 2012-07-02 NOTE — Progress Notes (Signed)
Patient ID: [name] [name], male   DOB: [birth year], 79 y.o.   MRN: [id number]   LOS: 5 days   Subjective: Didn't rest well due to left wrist pain, says it was hurt last night when they were turning him.  Objective: Vital signs in last 24 hours: Temp:  [97.1 F (36.2 C)-99 F (37.2 C)] 97.8 F (36.6 C) (01/20 0534) Pulse Rate:  [76-97] 76  (01/20 0534) Resp:  [16-20] 18  (01/20 0534) BP: (111-157)/(62-78) 111/74 mmHg (01/20 0534) SpO2:  [92 %-99 %] 96 % (01/20 0534) Last BM Date: 07/01/12   General appearance: alert and no distress Resp: clear to auscultation bilaterally Cardio: regular rate and rhythm GI: normal findings: bowel sounds normal and soft, non-tender Extremities: Left wrist mod-severe TTP dorsally and radially. +edema.   Assessment/Plan: Fall TBI w/SAH -- No obvious sequelae Anticoagulated secondary to afib -- Subtherapeutic on admission C2 fx -- collar Right clavicle fx -- Sling Left wrist pain -- Will check x-rays Multiple medical problems -- Home meds (except coumadin) FEN -- Will limit pt to hydrocodone for pain as he takes this at home VTE -- SCD's Dispo -- Pt confirms he has 24h assistance at home and would like to return there over a SNF. Will likely plan to d/c this afternoon pending wrist films.   Freeman Caldron, PA-C Pager: [phone] General Trauma PA Pager: [phone]   07/02/2012

## 2012-07-02 NOTE — Progress Notes (Signed)
HHPT/OT arranged for patient with [facility] by choice--pt has used them in the past.  Daughter is listed on facesheet as contact and wishes that all home visits be arranged through her as she keeps up with the schedules and appointments for the patient and his wife. Her number is listed on the facesheet as well and all phone numbers and addresses are confirmed as correct. Additiionally, a hospital bed has been arranged with [facility] to be delivered today. There are 24hr caregivers in the home so someone will be available to accept the delivery.

## 2012-07-02 NOTE — Progress Notes (Signed)
Patient for d/c home with [facility] and DME- also has around the clock private duty care at home with he and his wife (see CSW note from 06-28-12. SBIRT completed and filed. No current/active substance use/abuse.  Reece Levy, MSW, [facility]ia Majors [phone]

## 2012-07-03 NOTE — Discharge Summary (Signed)
[name], MD, MPH, FACS Pager: [phone]  

## 2012-08-10 ENCOUNTER — Other Ambulatory Visit: Payer: Self-pay | Admitting: Neurosurgery

## 2012-08-10 DIAGNOSIS — M542 Cervicalgia: Secondary | ICD-10-CM

## 2012-08-20 ENCOUNTER — Ambulatory Visit
Admission: RE | Admit: 2012-08-20 | Discharge: 2012-08-20 | Disposition: A | Discharge status: Home / Self Care | Payer: Medicare Other | Source: Ambulatory Visit | Attending: Neurosurgery | Admitting: Neurosurgery

## 2012-08-20 DIAGNOSIS — M542 Cervicalgia: Secondary | ICD-10-CM

## 2013-02-07 ENCOUNTER — Encounter: Payer: Self-pay | Admitting: Cardiology

## 2013-02-07 ENCOUNTER — Ambulatory Visit (INDEPENDENT_AMBULATORY_CARE_PROVIDER_SITE_OTHER): Payer: Medicare Other | Admitting: Cardiology

## 2013-02-07 VITALS — BP 128/62 | HR 52 | Ht 69.0 in | Wt 149.0 lb

## 2013-02-07 DIAGNOSIS — I4891 Unspecified atrial fibrillation: Secondary | ICD-10-CM

## 2013-02-07 DIAGNOSIS — Z7901 Long term (current) use of anticoagulants: Secondary | ICD-10-CM

## 2013-02-07 DIAGNOSIS — I10 Essential (primary) hypertension: Secondary | ICD-10-CM

## 2013-02-07 DIAGNOSIS — I6529 Occlusion and stenosis of unspecified carotid artery: Secondary | ICD-10-CM

## 2013-02-07 NOTE — Assessment & Plan Note (Signed)
Asymptomatic with good rate control. He is off she is high risk of falls in the future. Continue Coumadin for now. I will have him follow Dr. Delton See in a year.

## 2013-02-07 NOTE — Assessment & Plan Note (Signed)
Stable without symptoms. Continue secondary preventative therapy. I would not rescan his neck.

## 2013-02-07 NOTE — Progress Notes (Signed)
HPI [name] [name] returns today for evaluation and management of his history of carotid artery disease, history of atrial fibrillation and flutter.  Since I last saw him, he fell down a flight of stairs but avoided serious injury. He chipped his third vertebrae and broke his right collarbone with no neurological sequelae. He did have a small subarachnoid hemorrhage on CT scan. He is being kept on anticoagulation. He has had no further falls.  He denies any palpitations. His A. fib flutter has always been asymptomatic.  He will soon be 79 years old.  Past Medical History  Diagnosis Date  . Atrial fibrillation   . Long term (current) use of anticoagulants     Coumadin  . Occlusion and stenosis of carotid artery without mention of cerebral infarction   . Unspecified essential hypertension   . Elevated prostate specific antigen (PSA)   . Polymyalgia rheumatica   . Macular degeneration   . Epistaxis   . Carcinomas, basal cell   . Lower extremity weakness   . Lacunar stroke     Histiry of small right MCA Lacunar Stroke  . Peripheral edema     Current Outpatient Prescriptions  Medication Sig Dispense Refill  . benazepril (LOTENSIN) 10 MG tablet Take 10 mg by mouth every morning.       . diltiazem (CARDIZEM CD) 240 MG 24 hr capsule Take 240 mg by mouth every morning.       Marland Kitchen HYDROcodone-acetaminophen (NORCO/VICODIN) 5-325 MG per tablet Take 0.5-2 tablets by mouth every 4 (four) hours as needed (1/2 tab for mild pain, 1 tab for moderate pain, 2 tabs for severe pain).  30 tablet  0  . Multiple Vitamins-Minerals (MULTIVITAMIN WITH MINERALS) tablet Take 1 tablet by mouth every morning.       . vitamin B-12 (CYANOCOBALAMIN) 1000 MCG tablet Take 1,000 mcg by mouth every morning.      . warfarin (COUMADIN) 4 MG tablet as directed.       No current facility-administered medications for this visit.    Allergies  Allergen Reactions  . Penicillins Swelling    Face only.    Family History   Problem Relation Age of Onset  . Stroke Mother 32    History   Social History  . Marital Status: Married    Spouse Name: N/A    Number of Children: N/A  . Years of Education: N/A   Occupational History  . 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 Smoking status: Never Smoker   . Smokeless tobacco: Not on file  . Alcohol Use: Yes  . Drug Use: No  . Sexual Activity: Not on file   Other Topics Concern  . 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    ROS ALL NEGATIVE EXCEPT THOSE NOTED IN HPI  PE  General Appearance: well developed, well nourished in no acute distress, frail HEENT: symmetrical face, PERRLA,  Neck: no JVD, thyromegaly, or adenopathy, trachea midline Chest: symmetric without deformity Cardiac: PMI non-displaced, irregular rate and rhythm, normal S1, S2, no gallop or murmur Lung: clear to ausculation and percussion Vascular: Pulses reduced in the lower extremities but present Abdominal: nondistended, nontender, good bowel sounds, no HSM, no bruits Extremities: no cyanosis, clubbing or edema, no sign of DVT, no varicosities  Skin: normal color, no rashes Neuro: alert and oriented x  3, non-focal Pysch: normal affect  EKG Chronic A. fib with a well-controlled ventricular rate, inferior ST segment changes without acute change. BMET    Component Value Date/Time   NA 131* 06/30/2012 0630   K 4.1 06/30/2012 0630   CL 95* 06/30/2012 0630   CO2 20 06/30/2012 0630   GLUCOSE 130* 06/30/2012 0630   BUN 25* 06/30/2012 0630   CREATININE 1.19 06/30/2012 0630   CALCIUM 8.7 06/30/2012 0630   GFRNONAA 53* 06/30/2012 0630   GFRAA 61* 06/30/2012 0630    Lipid Panel  No results found for this basename: chol, trig, hdl, cholhdl, vldl, ldlcalc    CBC    Component Value Date/Time   WBC 10.5 06/29/2012 0550   RBC 3.55* 06/29/2012 0550   HGB 11.1* 06/29/2012 0550   HCT  33.6* 06/29/2012 0550   PLT 133* 06/29/2012 0550   MCV 94.6 06/29/2012 0550   MCH 31.3 06/29/2012 0550   MCHC 33.0 06/29/2012 0550   RDW 14.1 06/29/2012 0550   LYMPHSABS 0.9 06/27/2012 1459   MONOABS 0.7 06/27/2012 1459   EOSABS 0.1 06/27/2012 1459   BASOSABS 0.0 06/27/2012 1459

## 2013-02-07 NOTE — Patient Instructions (Addendum)
Your physician recommends that you continue on your current medications as directed. Please refer to the Current Medication list given to you today.  Your physician wants you to follow-up in: 1 year with Dr. Katarina Nelson.  You will receive a reminder letter in the mail two months in advance. If you don't receive a letter, please call our office to schedule the follow-up appointment.  

## 2013-06-12 ENCOUNTER — Emergency Department (HOSPITAL_COMMUNITY)
Admission: EM | Admit: 2013-06-12 | Discharge: 2013-06-12 | Disposition: A | Discharge status: Home / Self Care | Payer: Medicare Other | Attending: Emergency Medicine | Admitting: Emergency Medicine

## 2013-06-12 ENCOUNTER — Emergency Department (HOSPITAL_COMMUNITY): Payer: Medicare Other

## 2013-06-12 ENCOUNTER — Encounter (HOSPITAL_COMMUNITY): Payer: Self-pay | Admitting: Emergency Medicine

## 2013-06-12 DIAGNOSIS — Z85828 Personal history of other malignant neoplasm of skin: Secondary | ICD-10-CM | POA: Insufficient documentation

## 2013-06-12 DIAGNOSIS — Z8669 Personal history of other diseases of the nervous system and sense organs: Secondary | ICD-10-CM | POA: Insufficient documentation

## 2013-06-12 DIAGNOSIS — Z8673 Personal history of transient ischemic attack (TIA), and cerebral infarction without residual deficits: Secondary | ICD-10-CM | POA: Insufficient documentation

## 2013-06-12 DIAGNOSIS — Z79899 Other long term (current) drug therapy: Secondary | ICD-10-CM | POA: Insufficient documentation

## 2013-06-12 DIAGNOSIS — I4891 Unspecified atrial fibrillation: Secondary | ICD-10-CM | POA: Insufficient documentation

## 2013-06-12 DIAGNOSIS — Z7901 Long term (current) use of anticoagulants: Secondary | ICD-10-CM | POA: Insufficient documentation

## 2013-06-12 DIAGNOSIS — Z8739 Personal history of other diseases of the musculoskeletal system and connective tissue: Secondary | ICD-10-CM | POA: Insufficient documentation

## 2013-06-12 DIAGNOSIS — I10 Essential (primary) hypertension: Secondary | ICD-10-CM | POA: Insufficient documentation

## 2013-06-12 DIAGNOSIS — Z9889 Other specified postprocedural states: Secondary | ICD-10-CM | POA: Insufficient documentation

## 2013-06-12 DIAGNOSIS — J111 Influenza due to unidentified influenza virus with other respiratory manifestations: Secondary | ICD-10-CM | POA: Insufficient documentation

## 2013-06-12 DIAGNOSIS — Z880 Allergy status to penicillin: Secondary | ICD-10-CM | POA: Insufficient documentation

## 2013-06-12 DIAGNOSIS — Z792 Long term (current) use of antibiotics: Secondary | ICD-10-CM | POA: Insufficient documentation

## 2013-06-12 DIAGNOSIS — R0602 Shortness of breath: Secondary | ICD-10-CM | POA: Insufficient documentation

## 2013-06-12 LAB — BASIC METABOLIC PANEL
CO2: 23 mEq/L (ref 19–32)
Chloride: 103 mEq/L (ref 96–112)
Creatinine, Ser: 1.02 mg/dL (ref 0.50–1.35)
GFR calc Af Amer: 73 mL/min — ABNORMAL LOW (ref >90)
Glucose, Bld: 140 mg/dL — ABNORMAL HIGH (ref 70–99)
Potassium: 5.1 mEq/L (ref 3.7–5.3)
Sodium: 138 mEq/L (ref 137–147)

## 2013-06-12 LAB — TROPONIN I: Troponin I: <0.3 ng/mL (ref <0.30)

## 2013-06-12 LAB — CBC
HCT: 39.3 % (ref 39.0–52.0)
Platelets: 144 10*3/uL — ABNORMAL LOW (ref 150–400)
RBC: 4.02 MIL/uL — ABNORMAL LOW (ref 4.22–5.81)
WBC: 10.3 10*3/uL (ref 4.0–10.5)

## 2013-06-12 LAB — INFLUENZA PANEL BY PCR (TYPE A & B)
H1N1 flu by pcr: NOT DETECTED
Influenza A By PCR: POSITIVE — AB

## 2013-06-12 MED ORDER — NAPROXEN 250 MG PO TABS
500.0000 mg | ORAL_TABLET | Freq: Once | ORAL | Status: AC
  Administered 2013-06-12: 500 mg via ORAL
  Filled 2013-06-12: qty 2

## 2013-06-12 MED ORDER — ALBUTEROL SULFATE HFA 108 (90 BASE) MCG/ACT IN AERS
2.0000 | INHALATION_SPRAY | Freq: Once | RESPIRATORY_TRACT | Status: AC
  Administered 2013-06-12: 2 via RESPIRATORY_TRACT
  Filled 2013-06-12: qty 6.7

## 2013-06-12 MED ORDER — OSELTAMIVIR PHOSPHATE 75 MG PO CAPS: 75.0000 mg | ORAL_CAPSULE | Freq: Two times a day (BID) | ORAL | Status: DC

## 2013-06-12 MED ORDER — ALBUTEROL SULFATE (2.5 MG/3ML) 0.083% IN NEBU
5.0000 mg | INHALATION_SOLUTION | Freq: Once | RESPIRATORY_TRACT | Status: AC
  Administered 2013-06-12: 5 mg via RESPIRATORY_TRACT
  Filled 2013-06-12: qty 6

## 2013-06-12 MED ORDER — LEVOFLOXACIN 750 MG PO TABS: 750.0000 mg | ORAL_TABLET | Freq: Every day | ORAL | Status: DC

## 2013-06-12 MED ORDER — LEVOFLOXACIN 750 MG PO TABS
750.0000 mg | ORAL_TABLET | Freq: Once | ORAL | Status: AC
  Administered 2013-06-12: 750 mg via ORAL
  Filled 2013-06-12: qty 1

## 2013-06-12 MED ORDER — SODIUM CHLORIDE 0.9 % IV SOLN
Freq: Once | INTRAVENOUS | Status: AC
  Administered 2013-06-12: 04:00:00 via INTRAVENOUS

## 2013-06-12 NOTE — ED Provider Notes (Addendum)
CSN: [id number]     Arrival date & time 06/12/13  0126 History   First MD Initiated Contact with Patient 06/12/13 0206     Chief Complaint  Patient presents with  . Chest Pain   (Consider location/radiation/quality/duration/timing/severity/associated sxs/prior Treatment) HPI Comments: 79 year old male with history of atrial fibrillation on Coumadin, presents with a complaint of chest pain which has been present for the last several hours. The patient states that he has been coughing and having shortness of breath with intermittent fevers over the last week. His spouse has had similar symptoms. The patient does not smoke cigarettes though he has been exposed to cigarette smoke all of his married life, his wife is a heavy smoker. The patient denies any leg swelling, he has had some nasal stuffiness and a persistent cough which is nonproductive. He feels as though he has chest congestion with phlegm but is unable to get it out when he coughs. The chest pain is worse with deep breathing and worse with coughing. It is located in the sternal and left side of his chest. He denies any history of coronary disease though he does have a history of atrial fibrillation and is on long-term Coumadin.  Patient is a 79 y.o. male presenting with chest pain. The history is provided by the patient and a relative.  Chest Pain   Past Medical History  Diagnosis Date  . Atrial fibrillation   . Long term (current) use of anticoagulants     Coumadin  . Occlusion and stenosis of carotid artery without mention of cerebral infarction   . Unspecified essential hypertension   . Elevated prostate specific antigen (PSA)   . Polymyalgia rheumatica   . Macular degeneration   . Epistaxis   . Carcinomas, basal cell   . Lower extremity weakness   . Lacunar stroke     Histiry of small right MCA Lacunar Stroke  . Peripheral edema    Past Surgical History  Procedure Laterality Date  . Femur fracture surgery      Open  reduction and internal fixation of the left intertochanteric femur fracture. [name] Charlann Boxer, M.D.  . Hip surgery  2010    Right hip  . Hip surgery  2009    Left hip  . Back surgery  1985  . Brain surgery  1995    blood clot/metal plates in skull  . Hernia repair  1953  . Cardioversion    . Inguinal hernia repair  2012    right, [facility]  . Cataract extraction w/phaco  01/02/2012    Procedure: CATARACT EXTRACTION PHACO AND INTRAOCULAR LENS PLACEMENT (IOC);  Surgeon: Gemma Payor, MD;  Location: [facility];  Service: Ophthalmology;  Laterality: Left;  CDE 16.83   Family History  Problem Relation Age of Onset  . Stroke Mother 29   History  Substance Use Topics  . Smoking status: Never Smoker   . Smokeless tobacco: Not on file  . Alcohol Use: Yes    Review of Systems  Cardiovascular: Positive for chest pain.  All other systems reviewed and are negative.    Allergies  Penicillins  Home Medications   Current Outpatient Rx  Name  Route  Sig  Dispense  Refill  . benazepril (LOTENSIN) 10 MG tablet   Oral   Take 10 mg by mouth every morning.          . diltiazem (CARDIZEM CD) 240 MG 24 hr capsule   Oral   Take 240 mg by mouth every  morning.          Marland Kitchen HYDROcodone-acetaminophen (NORCO/VICODIN) 5-325 MG per tablet   Oral   Take 0.5-2 tablets by mouth every 4 (four) hours as needed (1/2 tab for mild pain, 1 tab for moderate pain, 2 tabs for severe pain).   30 tablet   0   . Multiple Vitamins-Minerals (MULTIVITAMIN WITH MINERALS) tablet   Oral   Take 1 tablet by mouth every morning.          . vitamin B-12 (CYANOCOBALAMIN) 1000 MCG tablet   Oral   Take 1,000 mcg by mouth every morning.         . warfarin (COUMADIN) 4 MG tablet      as directed.         Marland Kitchen levofloxacin (LEVAQUIN) 750 MG tablet   Oral   Take 1 tablet (750 mg total) by mouth daily.   7 tablet   0    BP 176/82  Pulse 75  Temp(Src) 98.3 F (36.8 C) (Oral)  Resp 18  Ht 5\' 8"  (1.727 m)  Wt 154 lb  (69.854 kg)  BMI 23.42 kg/m2  SpO2 97% Physical Exam  Nursing note and vitals reviewed. Constitutional: He appears well-developed and well-nourished.  HENT:  Head: Normocephalic and atraumatic.  Mouth/Throat: Oropharynx is clear and moist. No oropharyngeal exudate.  Eyes: Conjunctivae and EOM are normal. Pupils are equal, round, and reactive to light. Right eye exhibits no discharge. Left eye exhibits no discharge. No scleral icterus.  Neck: Normal range of motion. Neck supple. No JVD present. No thyromegaly present.  Cardiovascular: Normal rate, regular rhythm, normal heart sounds and intact distal pulses.  Exam reveals no gallop and no friction rub.   No murmur heard. Pulmonary/Chest: Effort normal. No respiratory distress. He has wheezes. He has rales.  Bilateral expiratory wheezing, rales at the left base, diffuse rhonchi  Abdominal: Soft. Bowel sounds are normal. He exhibits no distension and no mass. There is no tenderness.  Musculoskeletal: Normal range of motion. He exhibits no edema and no tenderness.  Lymphadenopathy:    He has no cervical adenopathy.  Neurological: He is alert. Coordination normal.  Skin: Skin is warm and dry. No rash noted. No erythema.  Psychiatric: He has a normal mood and affect. His behavior is normal.    ED Course  Procedures (including critical care time) Labs Review Labs Reviewed  CBC - Abnormal; Notable for the following:    RBC 4.02 (*)    Hemoglobin 12.9 (*)    Platelets 144 (*)    All other components within normal limits  BASIC METABOLIC PANEL - Abnormal; Notable for the following:    Glucose, Bld 140 (*)    GFR calc non Af Amer 63 (*)    GFR calc Af Amer 73 (*)    All other components within normal limits  TROPONIN I  INFLUENZA PANEL BY PCR   Imaging Review Dg Chest Port 1 View  06/12/2013   CLINICAL DATA:  Chest pain for 2 hr.  EXAM: PORTABLE CHEST - 1 VIEW  COMPARISON:  01/12/2011  FINDINGS: Cardiac enlargement without vascular  congestion. Diffuse interstitial changes in the lungs stable since previous study. No focal airspace disease. No blunting of costophrenic angles. No pneumothorax. Old appearing fracture deformity of the distal right clavicle. Calcified and tortuous aorta.  IMPRESSION: Cardiac enlargement. No evidence of active pulmonary disease. Old right clavicular fracture deformity.   Electronically Signed   By: Burman Nieves M.D.   On:  06/12/2013 02:18    EKG Interpretation    Date/Time:  Wednesday June 12 2013 01:32:02 EST Ventricular Rate:  68 PR Interval:    QRS Duration: 90 QT Interval:  396 QTC Calculation: 421 R Axis:   -24 Text Interpretation:  Undetermined rhythm Anteroseptal infarct (cited on or before 20-Nov-2007) Abnormal ECG When compared with ECG of 27-Jun-2012 18:03, ST less depressed in Lateral leads Nonspecific T wave abnormality no longer evident in Inferior leads No significant change was found Confirmed by Trameka Dorough  MD, Manika Hast ([id number]) on 06/12/2013 2:24:00 AM            MDM   1. Influenza-like illness    The patient appears to have pulmonary infection with either flu or pneumonia, chest x-ray pending, vital signs are reassuring as he is not hypotensive febrile nor tachycardic. Sats are 97% on room air, he has an EKG which shows PVCs on top of A. fib with Q waves in the anterior leads. There is no signs of ischemia.  Chest x-ray labs, albuterol treatment.  Chest x-ray does not appear to have a focal infiltrate however due to the rales at the left base I will treat the patient for a community acquired pneumonia. Oxygenation is normal, symptoms have improved after albuterol treatment. He will be discharged home with albuterol and an antibiotic. Discussion with the pharmacist, Coumadin and interaction released affected by fluoroscopy quinolone, Levaquin for 7 days.  Discussed all these findings and the treatment with the patient and the family members including the need to have  his Coumadin level rechecked and they are in agreement.   Meds given in ED:  Medications  levofloxacin (LEVAQUIN) tablet 750 mg (not administered)  albuterol (PROVENTIL HFA;VENTOLIN HFA) 108 (90 BASE) MCG/ACT inhaler 2 puff (not administered)  albuterol (PROVENTIL) (2.5 MG/3ML) 0.083% nebulizer solution 5 mg (5 mg Nebulization Given 06/12/13 0309)  0.9 %  sodium chloride infusion ( Intravenous New Bag/Given 06/12/13 0345)  naproxen (NAPROSYN) tablet 500 mg (500 mg Oral Given 06/12/13 0306)    New Prescriptions   LEVOFLOXACIN (LEVAQUIN) 750 MG TABLET    Take 1 tablet (750 mg total) by mouth daily.   Phoned in Tamiflu to pt's pharmacy at 5 AM.   Vida Roller, MD 06/12/13 [id number]  Vida Roller, MD 06/12/13 [phone]

## 2013-06-12 NOTE — ED Notes (Signed)
Patient states he is feeling nervous.  Patient with nonproductive cough at this time.

## 2013-06-12 NOTE — ED Notes (Signed)
Patient took ASA 324mg  oral at home prior to EMS arrival.

## 2013-06-12 NOTE — ED Notes (Signed)
Patient presents to ER via [facility] with c/o chest pain x 2 hours; per EMS, family and caregiver have had respiratory colds for several weeks.  Patient has also had respiratory illness for several weeks.

## 2013-06-12 NOTE — ED Notes (Signed)
Discharge instructions given and reviewed with patient and daughter.  Prescription given for Levaquin; effects and use explained.  Patient's daughter verbalized understanding for patient to complete all antibiotic.  Patient discharged home in good condition via wheelchair.

## 2013-07-18 IMAGING — CT CT CERVICAL SPINE W/O CM
5 series · 16 of 33 positions shown, 18 images · non-contrast
Comparison: Cervical spine CT 06/27/2012.

CLINICAL DATA: C2 fracture.

CT CERVICAL SPINE WITHOUT CONTRAST
TECHNIQUE: Multidetector CT imaging of the cervical spine was
performed. Multiplanar CT image reconstructions were also
generated.

[Series 3: c spine bone · axial · 0.36mm/px · z∈[+44,+106]mm · 2 of 76 slices shown]
[im 26/76  bone]
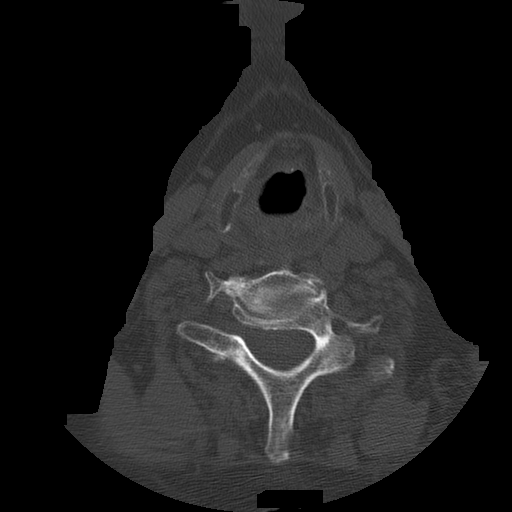
[im 51/76  bone]
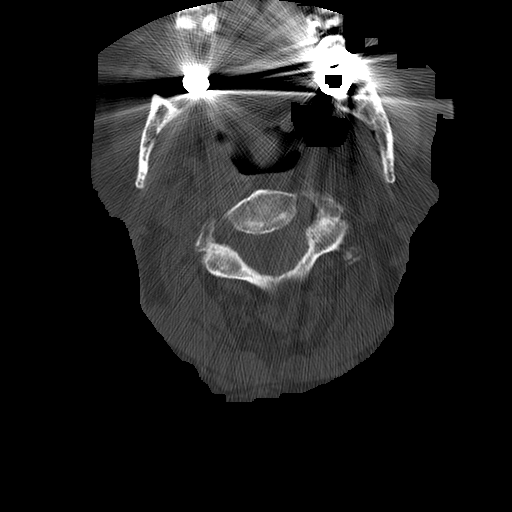

[Series 4: c spine soft · axial · 0.36mm/px · z∈[+44,+106]mm · 2 of 76 slices shown]
[im 26/76  soft-tissue]
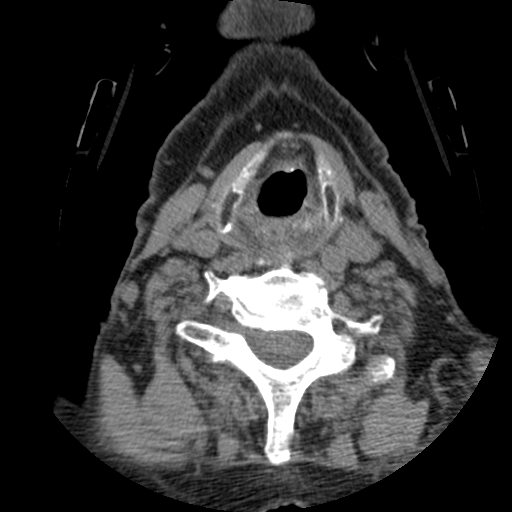
[im 51/76  soft-tissue]
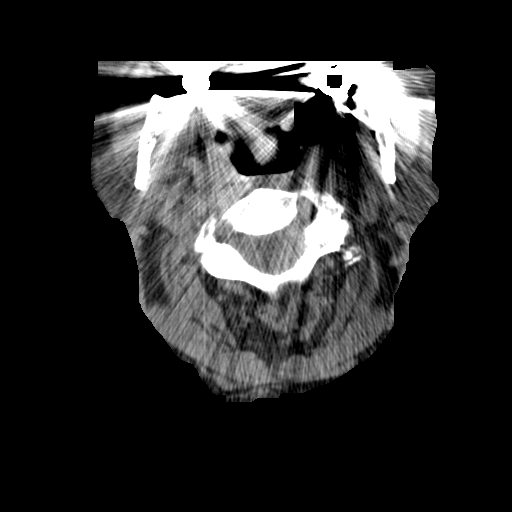

[Series 200: cor · coronal · 0.38mm/px · 3 of 45 slices shown]
[im 9/45  bone]
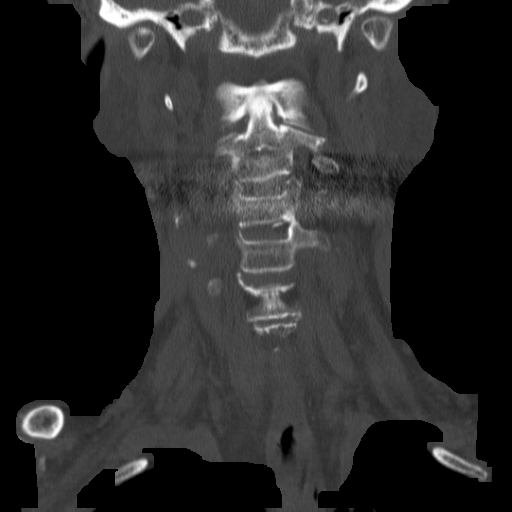
[im 18/45  bone]
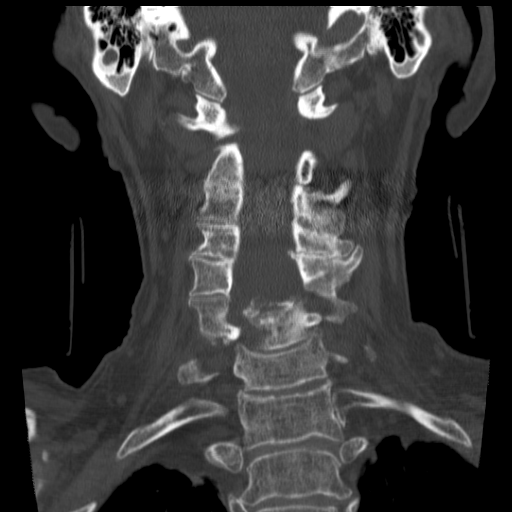
[im 27/45  bone]
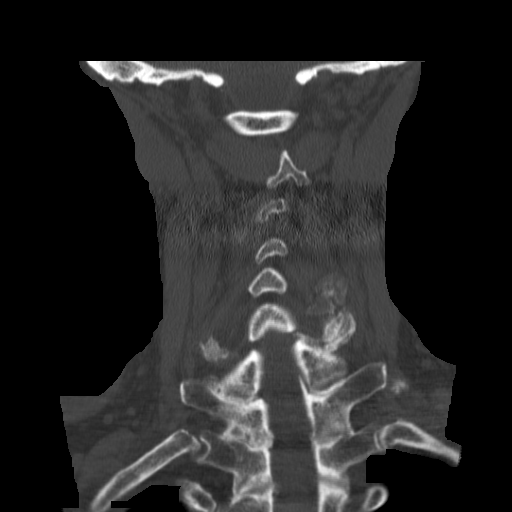

[Series 201: angled axial · axial · 0.38mm/px · z∈[-13,+98]mm · 4 of 106 slices shown, 5 images]
[im 22/106  soft-tissue]
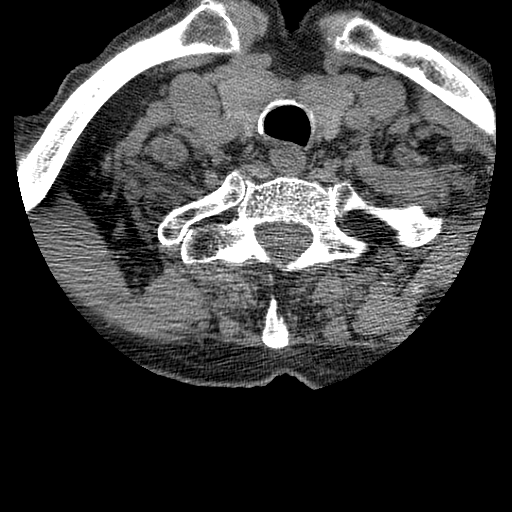
[im 22/106  bone]
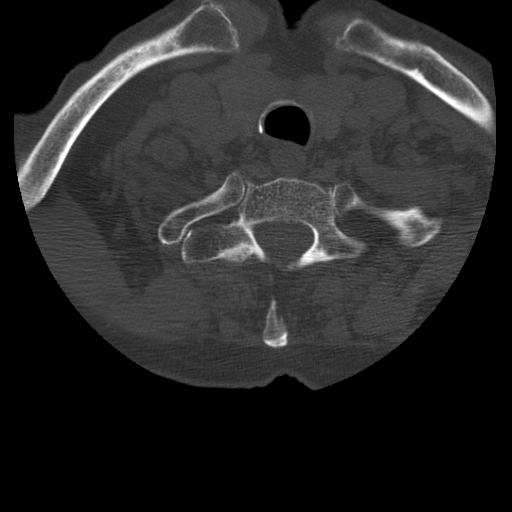
[im 43/106  bone]
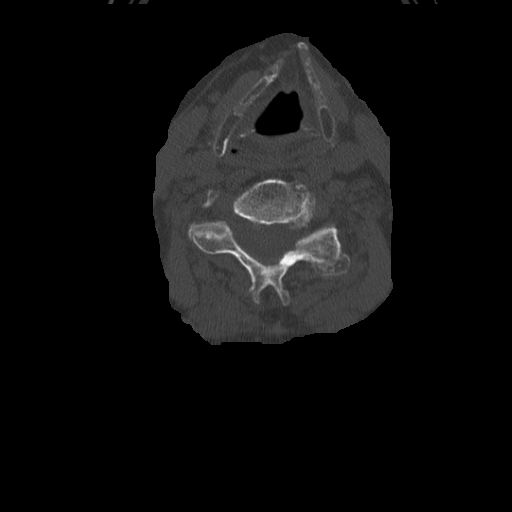
[im 64/106  bone]
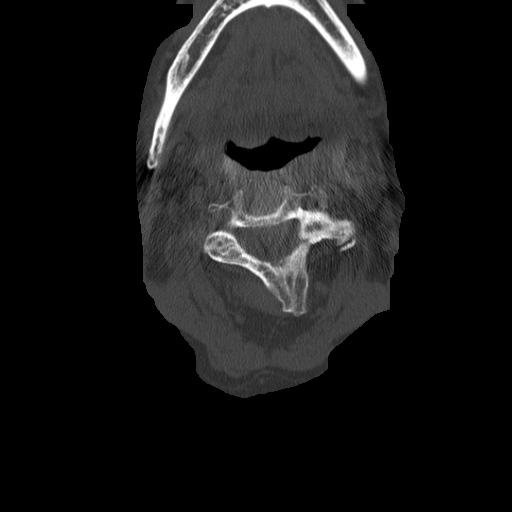
[im 85/106  bone]
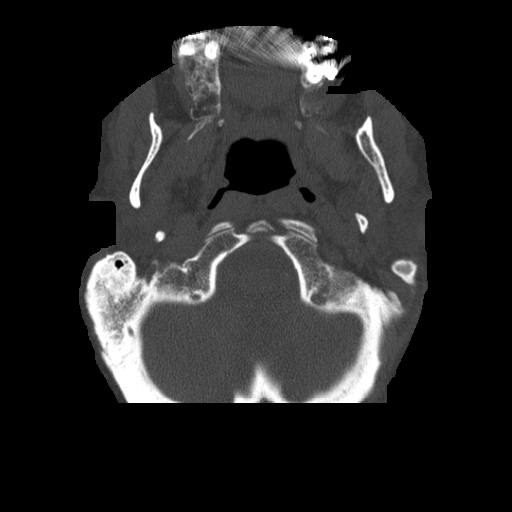

[Series 202: sag · sagittal · 0.38mm/px · 5 of 43 slices shown, 6 images]
[im 15/43  bone]
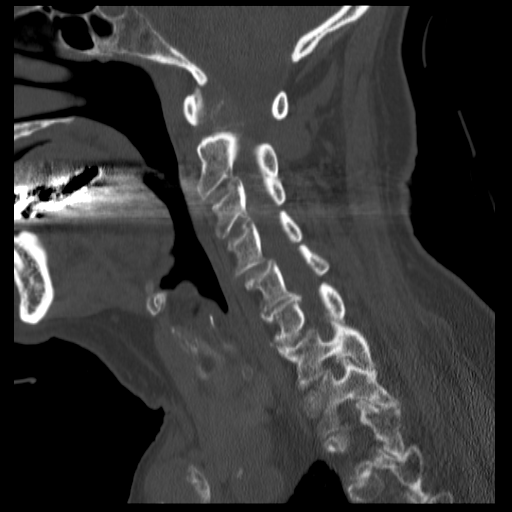
[im 18/43  bone]
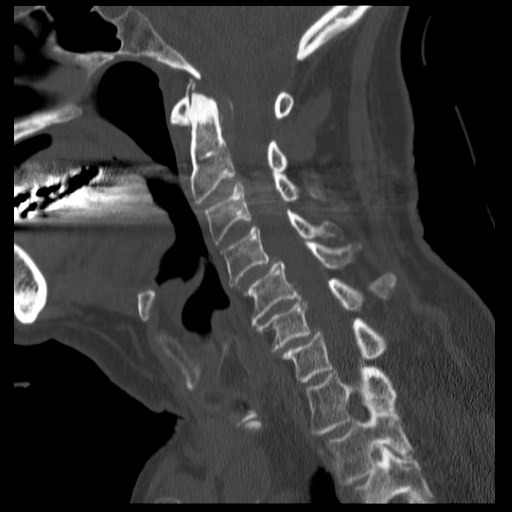
[im 22/43  soft-tissue]
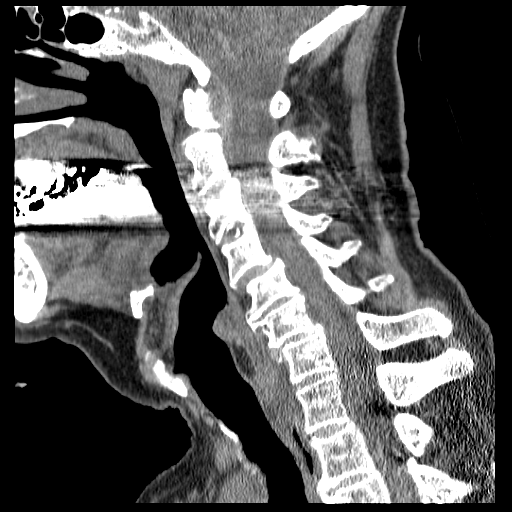
[im 22/43  bone]
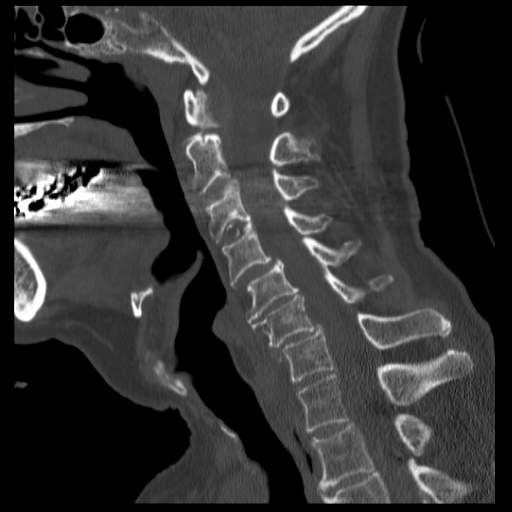
[im 25/43  bone]
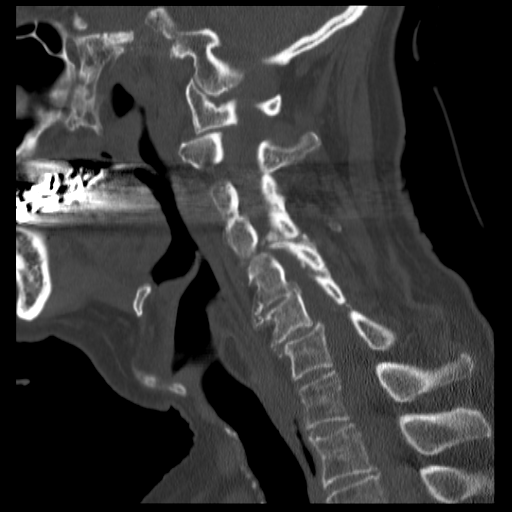
[im 29/43  bone]
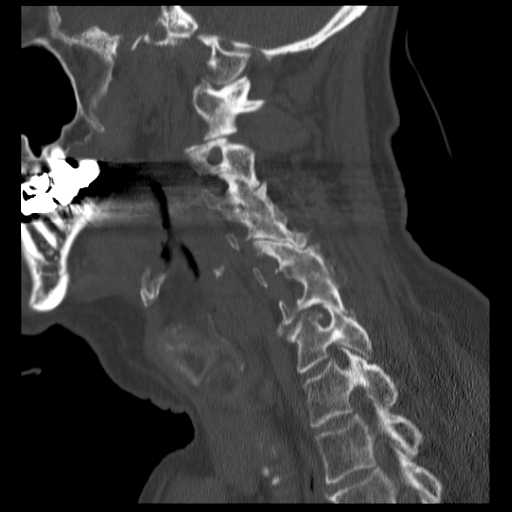

[16 of 33 positions shown; findings below may reference images not displayed]

FINDINGS: The previously identified fractures involving the left
lateral mass of C2 are less distinct on today's exam, suggesting
interval healing.  Degenerative anterolisthesis at C4-5 C5-6 is
otherwise stable.  The vertebral body heights are maintained.  No
new fracture is evident.  Degenerative changes are present at C1-2.
The soft tissues are within normal limits.  The lung apices are
clear.
IMPRESSION: 1.  Healing nondisplaced fracture of the left lateral mass of C2.
2.  Stable moderate spondylosis of the cervical spine.

## 2014-02-27 ENCOUNTER — Encounter: Payer: Self-pay | Admitting: Cardiology

## 2014-02-27 ENCOUNTER — Telehealth: Payer: Self-pay | Admitting: *Deleted

## 2014-02-27 ENCOUNTER — Ambulatory Visit (INDEPENDENT_AMBULATORY_CARE_PROVIDER_SITE_OTHER): Payer: Medicare Other | Admitting: Cardiology

## 2014-02-27 VITALS — BP 110/52 | HR 72

## 2014-02-27 DIAGNOSIS — I482 Chronic atrial fibrillation, unspecified: Secondary | ICD-10-CM

## 2014-02-27 DIAGNOSIS — Z7901 Long term (current) use of anticoagulants: Secondary | ICD-10-CM

## 2014-02-27 DIAGNOSIS — I6529 Occlusion and stenosis of unspecified carotid artery: Secondary | ICD-10-CM

## 2014-02-27 DIAGNOSIS — Z01818 Encounter for other preprocedural examination: Secondary | ICD-10-CM

## 2014-02-27 DIAGNOSIS — I951 Orthostatic hypotension: Secondary | ICD-10-CM

## 2014-02-27 DIAGNOSIS — I4891 Unspecified atrial fibrillation: Secondary | ICD-10-CM

## 2014-02-27 MED ORDER — BENAZEPRIL HCL 5 MG PO TABS: 5.0000 mg | ORAL_TABLET | Freq: Every day | ORAL | Status: DC

## 2014-02-27 MED ORDER — ASPIRIN EC 325 MG PO TBEC: 325.0000 mg | DELAYED_RELEASE_TABLET | Freq: Every day | ORAL | Status: DC

## 2014-02-27 NOTE — Telephone Encounter (Signed)
Pt needs an add on for carotid duplex for carotid artery disease per Dr Meda Coffee.  Pcc to follow-up and schedule this prior to his 2 month ov with Dr Meda Coffee

## 2014-02-27 NOTE — Patient Instructions (Addendum)
Your physician has recommended you make the following change in your medication:   STOP TAKING COUMADIN NOW  DECREASE YOUR BENAZEPRIL TO 5 MG DAILY  START TAKING ASPIRIN 325 MG DAILY   WE WILL FAX DR NELSON'S DICTATION NOTE FROM TODAY TO YOUR PCP.  Your physician has requested that you have a carotid duplex. This test is an ultrasound of the carotid arteries in your neck. It looks at blood flow through these arteries that supply the brain with blood. Allow one hour for this exam. There are no restrictions or special instructions. PRIOR TO YOUR 2 MONTH APPT WITH DR Meda Coffee     Your physician recommends that you schedule a follow-up appointment in: WITH DR NELSON IN 2 MONTHS

## 2014-02-27 NOTE — Progress Notes (Signed)
Patient ID: [name] [name], male   DOB: 09/28/1921, 80 y.o.   MRN: [id number]     Patient Name: [name] [name] Date of Encounter: 02/27/2014  Primary Care Provider:  Glenda Chroman., MD Primary Cardiologist: Dorothy Spark (Dr Verl Blalock)  Problem List   Past Medical History  Diagnosis Date  . Atrial fibrillation   . Long term (current) use of anticoagulants     Coumadin  . Occlusion and stenosis of carotid artery without mention of cerebral infarction   . Unspecified essential hypertension   . Elevated prostate specific antigen (PSA)   . Polymyalgia rheumatica   . Macular degeneration   . Epistaxis   . Carcinomas, basal cell   . Lower extremity weakness   . Lacunar stroke     Histiry of small right MCA Lacunar Stroke  . Peripheral edema    Past Surgical History  Procedure Laterality Date  . Femur fracture surgery      Open reduction and internal fixation of the left intertochanteric femur fracture. Pietro Cassis Alvan Dame, M.D.  . Hip surgery  2010    Right hip  . Hip surgery  2009    Left hip  . Back surgery  1985  . Brain surgery  1995    blood clot/metal plates in skull  . Hernia repair  1953  . Cardioversion    . Inguinal hernia repair  2012    right, [facility]  . Cataract extraction w/phaco  01/02/2012    Procedure: CATARACT EXTRACTION PHACO AND INTRAOCULAR LENS PLACEMENT (IOC);  Surgeon: Tonny Branch, MD;  Location: [facility];  Service: Ophthalmology;  Laterality: Left;  CDE 16.83   Allergies  Allergies  Allergen Reactions  . Penicillins Swelling    Face only.   HPI  [name] returns today for evaluation and management of his history of carotid artery disease, history of atrial fibrillation and flutter.  The patient was last sen by Dr Verl Blalock the last year. He is now coming for severe back pain that is limiting his activities. Until about 2 months ago he was able to walk with a walker and denies any chest pain and having stable DOE with walking. He denies any LE edema,  orthopnea or PND.   The patient and his daughter report few falls in the last year, the last in the spring where he fell off the shower and hurt his left hip. The last year he fell down a flight of stairs but avoided serious injury. He chipped his third vertebrae and broke his right collarbone with no neurological sequelae. CT head was negative for bleeding.  Dr Gladstone Lighter from [place] is sending him here to evaluate for possible Coumadine discontinuation and bridging necessity prior to the CT myelogram of the lumbar spine.   He denies any palpitations. His atrial fibrillation has always been asymptomatic.  He experiences dizziness upon standing up.  Home Medications  Prior to Admission medications   Medication Sig Start Date End Date Taking? Authorizing Provider  benazepril (LOTENSIN) 10 MG tablet Take 10 mg by mouth every morning.    Yes Historical Provider, MD  diltiazem (CARDIZEM CD) 240 MG 24 hr capsule Take 240 mg by mouth every morning.    Yes Historical Provider, MD  HYDROcodone-acetaminophen (NORCO/VICODIN) 5-325 MG per tablet Take 0.5-2 tablets by mouth every 4 (four) hours as needed (1/2 tab for mild pain, 1 tab for moderate pain, 2 tabs for severe pain). 07/02/12  Yes Lisette Abu, PA-C  Multiple Vitamins-Minerals (  MULTIVITAMIN WITH MINERALS) tablet Take 1 tablet by mouth every morning. Preservision.   Yes Historical Provider, MD  vitamin B-12 (CYANOCOBALAMIN) 1000 MCG tablet Take 1,000 mcg by mouth every morning.   Yes Historical Provider, MD  warfarin (COUMADIN) 4 MG tablet as directed. 12/23/12  Yes Historical Provider, MD    Family History  Family History  Problem Relation Age of Onset  . Stroke Mother 102    Social History  History   Social History  . Marital Status: Married    Spouse Name: N/A    Number of Children: N/A  . Years of Education: N/A   Occupational History  . 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 Smoking status: Never Smoker   . Smokeless tobacco: Not on file  . Alcohol Use: Yes  . Drug Use: No  . Sexual Activity: Not on file   Other Topics Concern  . 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  Cardiovascular:  No chest pain, dyspnea on exertion, edema, orthopnea, palpitations, paroxysmal nocturnal dyspnea. Dermatological: No rash, lesions/masses Respiratory: No cough, dyspnea Urologic: No hematuria, dysuria Abdominal:   No nausea, vomiting, diarrhea, bright red blood per rectum, melena, or hematemesis Neurologic:  No visual changes, wkns, changes in mental status. All other systems reviewed and are otherwise negative except as noted above.  Physical Exam  Blood pressure 110/52, pulse 72, SpO2 96.00%.  General: Pleasant, NAD, frail appearing Psych: Normal affect. Neuro: Alert and oriented X 3. Moves all extremities spontaneously. HEENT: Normal  Neck: Supple without bruits or JVD. Lungs:  Resp regular and unlabored, CTA. Heart: RRR no s3, s4, or murmurs. Abdomen: Soft, non-tender, non-distended, BS + x 4.  Extremities: No clubbing, cyanosis or edema. DP/PT/Radials 2+ and equal bilaterally.  Labs:  No results found for this basename: CKTOTAL, CKMB, TROPONINI,  in the last 72 hours Lab Results  Component Value Date   WBC 10.3 06/12/2013   HGB 12.9* 06/12/2013   HCT 39.3 06/12/2013   MCV 97.8 06/12/2013   PLT 144* 06/12/2013    No results found for this basename: DDIMER   No components found with this basename: POCBNP,     Component Value Date/Time   NA 138 06/12/2013 0148   K 5.1 06/12/2013 0148   CL 103 06/12/2013 0148   CO2 23 06/12/2013 0148   GLUCOSE 140* 06/12/2013 0148   BUN 21 06/12/2013 0148   CREATININE 1.02 06/12/2013 0148   CALCIUM 8.8 06/12/2013 0148    PROT 7.0 01/12/2011 1350   ALBUMIN 3.5 01/12/2011 1350   AST 16 01/12/2011 1350   ALT 11 01/12/2011 1350   ALKPHOS 108 01/12/2011 1350   BILITOT 0.5 01/12/2011 1350   GFRNONAA 63* 06/12/2013 0148   GFRAA 73* 06/12/2013 0148   No results found for this basename: CHOL, HDL, LDLCALC, TRIG    Accessory Clinical Findings  Echocardiogram   ECG - Atrial fibrillation, 83 BPM, PVCs   Assessment & Plan  A very pleasant 80 year old male  1. A-fibrillation - chronic persistent, rate controlled, we will continue Diltiazem for rate control,  We will stop coumadine. The patient had 2 serious falls in the last year, and with current back pain and leg  pain/weakness there is a high risk of repeated falls. It seems that he reached the point where risk of bleeding outweighs the benefit of stroke prevention. This was discussed with Dr Gladstone Lighter. He is ok for him to be started on Aspirin 325 mg po daily but it will need to be discontinued 3 days prior to CT myelogram.   2. Back pain - possible spinal stenosis, scheduled CT myeologram, anticoagulation management as above, Dr Gladstone Lighter notified  3. Hypotension - patient's BP borderline and low for his age, with some orthostatic hypotension - we will decrease Benazepril to 5 mg po daily.   4. H/O carotid disease - no scan in the last few years, we will order now.  5. Preopertaive evaluation - in case the patient requires orthopedic surgery there should be no contraindication from cardiac standpoint as he has no symptoms of angina or CHF. His most recent echocardiogram shows normal LVEF 65%.   Follow up in 2 months  Dorothy Spark, MD, [facility] 02/27/2014, 10:58 AM

## 2014-03-06 ENCOUNTER — Other Ambulatory Visit: Payer: Self-pay | Admitting: Orthopedic Surgery

## 2014-03-06 DIAGNOSIS — M48061 Spinal stenosis, lumbar region without neurogenic claudication: Secondary | ICD-10-CM

## 2014-03-17 ENCOUNTER — Other Ambulatory Visit: Payer: Self-pay

## 2014-03-17 ENCOUNTER — Other Ambulatory Visit: Payer: Medicare Other

## 2014-03-24 ENCOUNTER — Ambulatory Visit
Admission: RE | Admit: 2014-03-24 | Discharge: 2014-03-24 | Disposition: A | Discharge status: Home / Self Care | Payer: Medicare Other | Source: Ambulatory Visit | Attending: Orthopedic Surgery | Admitting: Orthopedic Surgery

## 2014-03-24 VITALS — BP 114/50 | HR 61

## 2014-03-24 DIAGNOSIS — M48061 Spinal stenosis, lumbar region without neurogenic claudication: Secondary | ICD-10-CM

## 2014-03-24 MED ORDER — IOHEXOL 180 MG/ML  SOLN
18.0000 mL | Freq: Once | INTRAMUSCULAR | Status: AC | PRN
  Administered 2014-03-24: 18 mL via INTRATHECAL

## 2014-03-24 MED ORDER — ONDANSETRON HCL 4 MG/2ML IJ SOLN
4.0000 mg | Freq: Once | INTRAMUSCULAR | Status: AC
  Administered 2014-03-24: 4 mg via INTRAMUSCULAR

## 2014-03-24 MED ORDER — MEPERIDINE HCL 100 MG/ML IJ SOLN
75.0000 mg | Freq: Once | INTRAMUSCULAR | Status: AC
  Administered 2014-03-24: 75 mg via INTRAMUSCULAR

## 2014-03-24 NOTE — Discharge Instructions (Signed)

## 2014-04-11 ENCOUNTER — Telehealth: Payer: Self-pay | Admitting: Cardiology

## 2014-04-11 NOTE — Telephone Encounter (Signed)
New problem   Need to know status of cardiac clearance that was fax for pt to have back surgery. Please advise

## 2014-04-11 NOTE — Telephone Encounter (Signed)
The pts daughter, [name] [name], is advised that Dr Meda Coffee does have the pts cardiac clearance form and that there is a note attached to his clearance form stating that he is scheduled to have an Echo on 11/16 and f/u with Dr Meda Coffee on 11/19.   [name] [name] states that the pt is in a lot of pain and that Dr Phill Myron office told her they are waiting to hear from Dr Meda Coffee before they can schedule his surgery.  She is requesting that someone call her as soon as possible to discuss getting the pt cleared sooner.  She is advised that I am sending this note to Dr Meda Coffee and Karlene Einstein to address.

## 2014-04-15 ENCOUNTER — Telehealth: Payer: Self-pay | Admitting: Cardiology

## 2014-04-15 ENCOUNTER — Other Ambulatory Visit: Payer: Self-pay | Admitting: Surgical

## 2014-04-15 NOTE — Telephone Encounter (Signed)
Notified emergency contacting Malachy Mood about faxed clearance letter and per Dr Meda Coffee it is ok to postpone the f/u with her to 3 months now.  EC states everything is in place and pt scheduled for surgery next week.  EC verbalized understanding.

## 2014-04-15 NOTE — Progress Notes (Signed)
Please put orders in Epic surgery 04-24-14 pre op 04-17-14 Thanks

## 2014-04-15 NOTE — Telephone Encounter (Signed)
Ivy, I have cleared him and Altha Harm faxed his form yesterday. He doesn't have an echo but US carotid scheduled for 04/28/14. He doesn't have to see me prior to his surgery. We can postpone my appointment for 3 months from now.  Please let them know. KN

## 2014-04-15 NOTE — Telephone Encounter (Signed)
Received request from Nurse fax box, documents faxed for surgical clearance. OR:[id number]  Fax number: [phone] Attention: 11.3.15/km

## 2014-04-17 ENCOUNTER — Encounter (HOSPITAL_COMMUNITY)
Admission: RE | Admit: 2014-04-17 | Discharge: 2014-04-17 | Disposition: A | Discharge status: Home / Self Care | Payer: Medicare Other | Source: Ambulatory Visit | Attending: Orthopedic Surgery | Admitting: Orthopedic Surgery

## 2014-04-17 ENCOUNTER — Ambulatory Visit (HOSPITAL_COMMUNITY)
Admission: RE | Admit: 2014-04-17 | Discharge: 2014-04-17 | Disposition: A | Discharge status: Home / Self Care | Payer: Medicare Other | Source: Ambulatory Visit | Attending: Surgical | Admitting: Surgical

## 2014-04-17 ENCOUNTER — Encounter (HOSPITAL_COMMUNITY): Payer: Self-pay

## 2014-04-17 DIAGNOSIS — Z01818 Encounter for other preprocedural examination: Secondary | ICD-10-CM | POA: Insufficient documentation

## 2014-04-17 DIAGNOSIS — M4806 Spinal stenosis, lumbar region: Secondary | ICD-10-CM | POA: Insufficient documentation

## 2014-04-17 HISTORY — DX: Spinal stenosis, lumbar region without neurogenic claudication: M48.061

## 2014-04-17 HISTORY — DX: Cardiac arrhythmia, unspecified: I49.9

## 2014-04-17 LAB — SURGICAL PCR SCREEN
MRSA, PCR: NEGATIVE
Staphylococcus aureus: NEGATIVE

## 2014-04-17 LAB — URINALYSIS, ROUTINE W REFLEX MICROSCOPIC
Bilirubin Urine: NEGATIVE
Glucose, UA: NEGATIVE mg/dL
Hgb urine dipstick: NEGATIVE
Ketones, ur: NEGATIVE mg/dL
Leukocytes, UA: NEGATIVE
Nitrite: NEGATIVE
Protein, ur: NEGATIVE mg/dL
Specific Gravity, Urine: 1.031 — ABNORMAL HIGH (ref 1.005–1.030)
Urobilinogen, UA: 1 mg/dL (ref 0.0–1.0)
pH: 5 (ref 5.0–8.0)

## 2014-04-17 LAB — CBC
HCT: 39.5 % (ref 39.0–52.0)
Hemoglobin: 12.7 g/dL — ABNORMAL LOW (ref 13.0–17.0)
MCH: 32.3 pg (ref 26.0–34.0)
MCHC: 32.2 g/dL (ref 30.0–36.0)
MCV: 100.5 fL — ABNORMAL HIGH (ref 78.0–100.0)
PLATELETS: 179 10*3/uL (ref 150–400)
RBC: 3.93 MIL/uL — ABNORMAL LOW (ref 4.22–5.81)
RDW: 14.5 % (ref 11.5–15.5)
WBC: 7.5 10*3/uL (ref 4.0–10.5)

## 2014-04-17 LAB — COMPREHENSIVE METABOLIC PANEL
ALT: 8 U/L (ref 0–53)
AST: 17 U/L (ref 0–37)
Albumin: 3.6 g/dL (ref 3.5–5.2)
Alkaline Phosphatase: 117 U/L (ref 39–117)
Anion gap: 12 (ref 5–15)
BUN: 23 mg/dL (ref 6–23)
CO2: 23 mEq/L (ref 19–32)
Calcium: 9.3 mg/dL (ref 8.4–10.5)
Chloride: 105 mEq/L (ref 96–112)
Creatinine, Ser: 1.05 mg/dL (ref 0.50–1.35)
GFR calc Af Amer: 70 mL/min — ABNORMAL LOW (ref >90)
GFR calc non Af Amer: 60 mL/min — ABNORMAL LOW (ref >90)
Glucose, Bld: 89 mg/dL (ref 70–99)
Potassium: 5.2 mEq/L (ref 3.7–5.3)
Sodium: 140 mEq/L (ref 137–147)
Total Bilirubin: 0.3 mg/dL (ref 0.3–1.2)
Total Protein: 7.3 g/dL (ref 6.0–8.3)

## 2014-04-17 LAB — PROTIME-INR
INR: 1.14 (ref 0.00–1.49)
Prothrombin Time: 14.7 seconds (ref 11.6–15.2)

## 2014-04-17 NOTE — Progress Notes (Signed)
   04/17/14 1330  OBSTRUCTIVE SLEEP APNEA  Have you ever been diagnosed with sleep apnea through a sleep study? No  Do you snore loudly (loud enough to be heard through closed doors)?  0  Do you often feel tired, fatigued, or sleepy during the daytime? 1  Has anyone observed you stop breathing during your sleep? 0  Do you have, or are you being treated for high blood pressure? 1  BMI more than 35 kg/m2? 0  Age over 78 years old? 1  Neck circumference greater than 40 cm/16 inches? 0  Gender: 1  Obstructive Sleep Apnea Score 4

## 2014-04-17 NOTE — Pre-Procedure Instructions (Addendum)
CXR REPORT IN EPIC FROM 06-12-13 EKG REPORT 02-27-14 AND CARDIOLOGY OFFICE NOTE 02-27-14 IN EPIC  AND NOTE OF CARDIAC CLEARANCE ON CHART FROM DR. NELSON. LUMBAR XRAY WAS DONE TODAY. CHART GIVEN TO FOLLOW  UP NURSE SHARON SHOFFNER, RN FOR REVIEW OF ALL PREOP LAB RESULTS.

## 2014-04-17 NOTE — Patient Instructions (Addendum)
YOUR SURGERY IS SCHEDULED AT [facility]  ON:  Thursday  November 12  REPORT TO  SHORT STAY CENTER AT:  1:30 PM    DO NOT EAT ANYTHING AFTER MIDNIGHT THE NIGHT BEFORE YOUR SURGERY.  NO FOOD,  NO CHEWING GUM, NO MINTS, NO CANDIES, NO CHEWING TOBACCO. YOU MAY HAVE CLEAR LIQUIDS TO DRINK FROM MIDNIGHT UNTIL 9:30 AM DAY OF SURGERY - LIKE WATER, COFFEE - NO MILK OR CREAM, APPLE OR GRAPE JUICE.  NOTHING TO DRINK AFTER 9:30 AM DAY OF SURGERY.  PLEASE TAKE THE FOLLOWING MEDICATIONS THE AM OF YOUR SURGERY WITH A FEW SIPS OF WATER:  DILTIAZEM   DO NOT BRING VALUABLES, MONEY, CREDIT CARDS.  DO NOT WEAR JEWELRY, MAKE-UP, NAIL POLISH AND NO METAL PINS OR CLIPS IN YOUR HAIR. CONTACT LENS, DENTURES / PARTIALS, GLASSES SHOULD NOT BE WORN TO SURGERY AND IN MOST CASES-HEARING AIDS WILL NEED TO BE REMOVED.  BRING YOUR GLASSES CASE, ANY EQUIPMENT NEEDED FOR YOUR CONTACT LENS. FOR PATIENTS ADMITTED TO THE HOSPITAL--CHECK OUT TIME THE DAY OF DISCHARGE IS 11:00 AM.  ALL INPATIENT ROOMS ARE PRIVATE - WITH BATHROOM, TELEPHONE, TELEVISION AND WIFI INTERNET.      PLEASE BE AWARE THAT YOU MAY NEED ADDITIONAL BLOOD DRAWN DAY OF YOUR SURGERY  _______________________________________________________________________   [facility] - Preparing for Surgery Before surgery, you can play an important role.  Because skin is not sterile, your skin needs to be as free of germs as possible.  You can reduce the number of germs on your skin by washing with CHG (chlorahexidine gluconate) soap before surgery.  CHG is an antiseptic cleaner which kills germs and bonds with the skin to continue killing germs even after washing. Please DO NOT use if you have an allergy to CHG or antibacterial soaps.  If your skin becomes reddened/irritated stop using the CHG and inform your nurse when you arrive at Short Stay. Do not shave (including legs and underarms) for at least 48 hours prior to the first CHG shower.  You may shave  your face/neck. Please follow these instructions carefully:  1.  Shower with CHG Soap the night before surgery and the  morning of Surgery.  2.  If you choose to wash your hair, wash your hair first as usual with your  normal  shampoo.  3.  After you shampoo, rinse your hair and body thoroughly to remove the  shampoo.                           4.  Use CHG as you would any other liquid soap.  You can apply chg directly  to the skin and wash                       Gently with a scrungie or clean washcloth.  5.  Apply the CHG Soap to your body ONLY FROM THE NECK DOWN.   Do not use on face/ open                           Wound or open sores. Avoid contact with eyes, ears mouth and genitals (private parts).                       Wash face,  Genitals (private parts) with your normal soap.  6.  Wash thoroughly, paying special attention to the area where your surgery  will be performed.  7.  Thoroughly rinse your body with warm water from the neck down.  8.  DO NOT shower/wash with your normal soap after using and rinsing off  the CHG Soap.                9.  Pat yourself dry with a clean towel.            10.  Wear clean pajamas.            11.  Place clean sheets on your bed the night of your first shower and do not  sleep with pets. Day of Surgery : Do not apply any lotions/deodorants the morning of surgery.  Please wear clean clothes to the hospital/surgery center.  FAILURE TO FOLLOW THESE INSTRUCTIONS MAY RESULT IN THE CANCELLATION OF YOUR SURGERY PATIENT SIGNATURE_________________________________  NURSE SIGNATURE__________________________________  ________________________________________________________________________   Adam Phenix  An incentive spirometer is a tool that can help keep your lungs clear and active. This tool measures how well you are filling your lungs with each breath. Taking long deep breaths may help reverse or decrease the chance of developing  breathing (pulmonary) problems (especially infection) following:  A long period of time when you are unable to move or be active. BEFORE THE PROCEDURE   If the spirometer includes an indicator to show your best effort, your nurse or respiratory therapist will set it to a desired goal.  If possible, sit up straight or lean slightly forward. Try not to slouch.  Hold the incentive spirometer in an upright position. INSTRUCTIONS FOR USE  1. Sit on the edge of your bed if possible, or sit up as far as you can in bed or on a chair. 2. Hold the incentive spirometer in an upright position. 3. Breathe out normally. 4. Place the mouthpiece in your mouth and seal your lips tightly around it. 5. Breathe in slowly and as deeply as possible, raising the piston or the ball toward the top of the column. 6. Hold your breath for 3-5 seconds or for as long as possible. Allow the piston or ball to fall to the bottom of the column. 7. Remove the mouthpiece from your mouth and breathe out normally. 8. Rest for a few seconds and repeat Steps 1 through 7 at least 10 times every 1-2 hours when you are awake. Take your time and take a few normal breaths between deep breaths. 9. The spirometer may include an indicator to show your best effort. Use the indicator as a goal to work toward during each repetition. 10. After each set of 10 deep breaths, practice coughing to be sure your lungs are clear. If you have an incision (the cut made at the time of surgery), support your incision when coughing by placing a pillow or rolled up towels firmly against it. Once you are able to get out of bed, walk around indoors and cough well. You may stop using the incentive spirometer when instructed by your caregiver.  RISKS AND COMPLICATIONS  Take your time so you do not get dizzy or light-headed.  If you are in pain, you may need to take or ask for pain medication before doing incentive spirometry. It is harder to take a deep  breath if you are having pain. AFTER USE  Rest and breathe slowly and easily.  It can be helpful to keep track of a log of  your progress. Your caregiver can provide you with a simple table to help with this. If you are using the spirometer at home, follow these instructions: [place] IF:   You are having difficultly using the spirometer.  You have trouble using the spirometer as often as instructed.  Your pain medication is not giving enough relief while using the spirometer.  You develop fever of 100.5 F (38.1 C) or higher. SEEK IMMEDIATE MEDICAL CARE IF:   You cough up bloody sputum that had not been present before.  You develop fever of 102 F (38.9 C) or greater.  You develop worsening pain at or near the incision site. MAKE SURE YOU:   Understand these instructions.  Will watch your condition.  Will get help right away if you are not doing well or get worse. Document Released: 10/10/2006 Document Revised: 08/22/2011 Document Reviewed: 12/11/2006 [facility] Patient Information 2014 [place], [place].   ________________________________________________________________________
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
## 2014-04-24 ENCOUNTER — Encounter (HOSPITAL_COMMUNITY): Payer: Self-pay | Admitting: *Deleted

## 2014-04-24 ENCOUNTER — Encounter (HOSPITAL_COMMUNITY)
Admission: RE | Disposition: A | Discharge status: Home / Self Care | Payer: Self-pay | Source: Ambulatory Visit | Attending: Orthopedic Surgery

## 2014-04-24 ENCOUNTER — Ambulatory Visit (HOSPITAL_COMMUNITY): Payer: Medicare Other

## 2014-04-24 ENCOUNTER — Ambulatory Visit (HOSPITAL_COMMUNITY): Payer: Medicare Other | Admitting: Anesthesiology

## 2014-04-24 ENCOUNTER — Observation Stay (HOSPITAL_COMMUNITY)
Admission: RE | Admit: 2014-04-24 | Discharge: 2014-04-25 | Disposition: A | Discharge status: Home / Self Care | Payer: Medicare Other | Source: Ambulatory Visit | Attending: Orthopedic Surgery | Admitting: Orthopedic Surgery

## 2014-04-24 DIAGNOSIS — I10 Essential (primary) hypertension: Secondary | ICD-10-CM | POA: Diagnosis not present

## 2014-04-24 DIAGNOSIS — M353 Polymyalgia rheumatica: Secondary | ICD-10-CM | POA: Insufficient documentation

## 2014-04-24 DIAGNOSIS — M48062 Spinal stenosis, lumbar region with neurogenic claudication: Secondary | ICD-10-CM | POA: Diagnosis present

## 2014-04-24 DIAGNOSIS — R972 Elevated prostate specific antigen [PSA]: Secondary | ICD-10-CM | POA: Insufficient documentation

## 2014-04-24 DIAGNOSIS — Z7901 Long term (current) use of anticoagulants: Secondary | ICD-10-CM | POA: Diagnosis not present

## 2014-04-24 DIAGNOSIS — Z880 Allergy status to penicillin: Secondary | ICD-10-CM | POA: Diagnosis not present

## 2014-04-24 DIAGNOSIS — M21372 Foot drop, left foot: Secondary | ICD-10-CM | POA: Diagnosis not present

## 2014-04-24 DIAGNOSIS — Z419 Encounter for procedure for purposes other than remedying health state, unspecified: Secondary | ICD-10-CM

## 2014-04-24 DIAGNOSIS — I4891 Unspecified atrial fibrillation: Secondary | ICD-10-CM | POA: Insufficient documentation

## 2014-04-24 DIAGNOSIS — H3530 Unspecified macular degeneration: Secondary | ICD-10-CM | POA: Diagnosis not present

## 2014-04-24 DIAGNOSIS — M4806 Spinal stenosis, lumbar region: Principal | ICD-10-CM | POA: Insufficient documentation

## 2014-04-24 HISTORY — PX: LUMBAR LAMINECTOMY/DECOMPRESSION MICRODISCECTOMY: SHX5026

## 2014-04-24 SURGERY — LUMBAR LAMINECTOMY/DECOMPRESSION MICRODISCECTOMY 2 LEVELS
Anesthesia: General | Site: Back

## 2014-04-24 MED ORDER — HYDROCODONE-ACETAMINOPHEN 5-325 MG PO TABS
1.0000 | ORAL_TABLET | ORAL | Status: DC | PRN
  Administered 2014-04-25 (×2): 1 via ORAL
  Filled 2014-04-24 (×2): qty 1

## 2014-04-24 MED ORDER — BISACODYL 5 MG PO TBEC: 5.0000 mg | DELAYED_RELEASE_TABLET | Freq: Every day | ORAL | Status: DC | PRN

## 2014-04-24 MED ORDER — VANCOMYCIN HCL 500 MG IV SOLR
500.0000 mg | Freq: Once | INTRAVENOUS | Status: AC
  Administered 2014-04-25: 500 mg via INTRAVENOUS
  Filled 2014-04-24: qty 500

## 2014-04-24 MED ORDER — NEOSTIGMINE METHYLSULFATE 10 MG/10ML IV SOLN
INTRAVENOUS | Status: DC | PRN
  Administered 2014-04-24: 3 mg via INTRAVENOUS

## 2014-04-24 MED ORDER — HYDROMORPHONE HCL 1 MG/ML IJ SOLN: 0.5000 mg | INTRAMUSCULAR | Status: DC | PRN

## 2014-04-24 MED ORDER — BUPIVACAINE-EPINEPHRINE (PF) 0.25% -1:200000 IJ SOLN
INTRAMUSCULAR | Status: DC | PRN
  Administered 2014-04-24: 12 mL

## 2014-04-24 MED ORDER — METHOCARBAMOL 1000 MG/10ML IJ SOLN
500.0000 mg | Freq: Four times a day (QID) | INTRAMUSCULAR | Status: DC | PRN
  Administered 2014-04-24: 500 mg via INTRAVENOUS
  Filled 2014-04-24 (×2): qty 5

## 2014-04-24 MED ORDER — FENTANYL CITRATE 0.05 MG/ML IJ SOLN
INTRAMUSCULAR | Status: DC | PRN
  Administered 2014-04-24: 50 ug via INTRAVENOUS
  Administered 2014-04-24 (×2): 25 ug via INTRAVENOUS
  Administered 2014-04-24: 50 ug via INTRAVENOUS

## 2014-04-24 MED ORDER — POLYETHYLENE GLYCOL 3350 17 G PO PACK: 17.0000 g | PACK | Freq: Every day | ORAL | Status: DC | PRN

## 2014-04-24 MED ORDER — LACTATED RINGERS IV SOLN
INTRAVENOUS | Status: DC
  Administered 2014-04-24 – 2014-04-25 (×2): via INTRAVENOUS

## 2014-04-24 MED ORDER — FENTANYL CITRATE 0.05 MG/ML IJ SOLN: 25.0000 ug | INTRAMUSCULAR | Status: DC | PRN

## 2014-04-24 MED ORDER — GLYCOPYRROLATE 0.2 MG/ML IJ SOLN
INTRAMUSCULAR | Status: AC
  Filled 2014-04-24: qty 2

## 2014-04-24 MED ORDER — KETOROLAC TROMETHAMINE 30 MG/ML IJ SOLN: 15.0000 mg | Freq: Once | INTRAMUSCULAR | Status: DC | PRN

## 2014-04-24 MED ORDER — ACETAMINOPHEN 650 MG RE SUPP: 650.0000 mg | RECTAL | Status: DC | PRN

## 2014-04-24 MED ORDER — CISATRACURIUM BESYLATE (PF) 10 MG/5ML IV SOLN
INTRAVENOUS | Status: DC | PRN
  Administered 2014-04-24: 1 mg via INTRAVENOUS
  Administered 2014-04-24: 4 mg via INTRAVENOUS

## 2014-04-24 MED ORDER — THROMBIN 5000 UNITS EX SOLR
CUTANEOUS | Status: DC | PRN
  Administered 2014-04-24: 5000 [IU] via TOPICAL

## 2014-04-24 MED ORDER — PROMETHAZINE HCL 25 MG/ML IJ SOLN
6.2500 mg | INTRAMUSCULAR | Status: DC | PRN
Start: 2014-04-24 — End: 2014-04-24

## 2014-04-24 MED ORDER — GLYCOPYRROLATE 0.2 MG/ML IJ SOLN
INTRAMUSCULAR | Status: DC | PRN
  Administered 2014-04-24: .4 mg via INTRAVENOUS

## 2014-04-24 MED ORDER — BUPIVACAINE LIPOSOME 1.3 % IJ SUSP
20.0000 mL | Freq: Once | INTRAMUSCULAR | Status: AC
  Administered 2014-04-24: 20 mL
  Filled 2014-04-24: qty 20

## 2014-04-24 MED ORDER — METHOCARBAMOL 500 MG PO TABS: 500.0000 mg | ORAL_TABLET | Freq: Four times a day (QID) | ORAL | Status: DC | PRN

## 2014-04-24 MED ORDER — LIDOCAINE HCL (CARDIAC) 20 MG/ML IV SOLN
INTRAVENOUS | Status: DC | PRN
  Administered 2014-04-24: 70 mg via INTRAVENOUS

## 2014-04-24 MED ORDER — ACETAMINOPHEN 325 MG PO TABS: 650.0000 mg | ORAL_TABLET | ORAL | Status: DC | PRN

## 2014-04-24 MED ORDER — VANCOMYCIN HCL IN DEXTROSE 1-5 GM/200ML-% IV SOLN
1000.0000 mg | INTRAVENOUS | Status: AC
  Administered 2014-04-24: 1000 mg via INTRAVENOUS

## 2014-04-24 MED ORDER — THROMBIN 5000 UNITS EX SOLR
CUTANEOUS | Status: AC
  Filled 2014-04-24: qty 5000

## 2014-04-24 MED ORDER — BACITRACIN-NEOMYCIN-POLYMYXIN 400-5-5000 EX OINT
TOPICAL_OINTMENT | CUTANEOUS | Status: AC
  Filled 2014-04-24: qty 1

## 2014-04-24 MED ORDER — SODIUM CHLORIDE 0.9 % IR SOLN
Status: AC
  Filled 2014-04-24: qty 1

## 2014-04-24 MED ORDER — DEXAMETHASONE SODIUM PHOSPHATE 10 MG/ML IJ SOLN
INTRAMUSCULAR | Status: AC
  Filled 2014-04-24: qty 1

## 2014-04-24 MED ORDER — CISATRACURIUM BESYLATE 20 MG/10ML IV SOLN
INTRAVENOUS | Status: AC
  Filled 2014-04-24: qty 10

## 2014-04-24 MED ORDER — DEXAMETHASONE SODIUM PHOSPHATE 10 MG/ML IJ SOLN
INTRAMUSCULAR | Status: DC | PRN
  Administered 2014-04-24: 10 mg via INTRAVENOUS

## 2014-04-24 MED ORDER — DILTIAZEM HCL ER COATED BEADS 240 MG PO CP24
240.0000 mg | ORAL_CAPSULE | Freq: Every morning | ORAL | Status: DC
  Administered 2014-04-25: 240 mg via ORAL
  Filled 2014-04-24: qty 1

## 2014-04-24 MED ORDER — BUPIVACAINE-EPINEPHRINE (PF) 0.25% -1:200000 IJ SOLN
INTRAMUSCULAR | Status: AC
  Filled 2014-04-24: qty 30

## 2014-04-24 MED ORDER — PHENOL 1.4 % MT LIQD: 1.0000 | OROMUCOSAL | Status: DC | PRN

## 2014-04-24 MED ORDER — SUCCINYLCHOLINE CHLORIDE 20 MG/ML IJ SOLN
INTRAMUSCULAR | Status: DC | PRN
  Administered 2014-04-24: 80 mg via INTRAVENOUS

## 2014-04-24 MED ORDER — ONDANSETRON HCL 4 MG/2ML IJ SOLN
INTRAMUSCULAR | Status: AC
  Filled 2014-04-24: qty 2

## 2014-04-24 MED ORDER — LACTATED RINGERS IV SOLN
INTRAVENOUS | Status: DC
  Administered 2014-04-24: 1000 mL via INTRAVENOUS

## 2014-04-24 MED ORDER — SODIUM CHLORIDE 0.9 % IR SOLN
Status: DC | PRN
  Administered 2014-04-24: 500 mL

## 2014-04-24 MED ORDER — PROPOFOL 10 MG/ML IV BOLUS
INTRAVENOUS | Status: AC
  Filled 2014-04-24: qty 20

## 2014-04-24 MED ORDER — ONDANSETRON HCL 4 MG/2ML IJ SOLN
INTRAMUSCULAR | Status: DC | PRN
  Administered 2014-04-24: 4 mg via INTRAVENOUS

## 2014-04-24 MED ORDER — VANCOMYCIN HCL IN DEXTROSE 1-5 GM/200ML-% IV SOLN
INTRAVENOUS | Status: AC
  Filled 2014-04-24: qty 200

## 2014-04-24 MED ORDER — FENTANYL CITRATE 0.05 MG/ML IJ SOLN
INTRAMUSCULAR | Status: AC
  Filled 2014-04-24: qty 5

## 2014-04-24 MED ORDER — FLEET ENEMA 7-19 GM/118ML RE ENEM: 1.0000 | ENEMA | Freq: Once | RECTAL | Status: AC | PRN

## 2014-04-24 MED ORDER — ONDANSETRON HCL 4 MG/2ML IJ SOLN
4.0000 mg | INTRAMUSCULAR | Status: DC | PRN
Start: 2014-04-24 — End: 2014-04-25

## 2014-04-24 MED ORDER — ACETAMINOPHEN 10 MG/ML IV SOLN
1000.0000 mg | Freq: Once | INTRAVENOUS | Status: AC
  Administered 2014-04-24: 1000 mg via INTRAVENOUS
  Filled 2014-04-24: qty 100

## 2014-04-24 MED ORDER — OXYCODONE-ACETAMINOPHEN 5-325 MG PO TABS: 1.0000 | ORAL_TABLET | ORAL | Status: DC | PRN

## 2014-04-24 MED ORDER — BENAZEPRIL HCL 5 MG PO TABS
5.0000 mg | ORAL_TABLET | Freq: Every day | ORAL | Status: DC
  Administered 2014-04-24 – 2014-04-25 (×2): 5 mg via ORAL
  Filled 2014-04-24 (×2): qty 1

## 2014-04-24 MED ORDER — PROPOFOL 10 MG/ML IV BOLUS
INTRAVENOUS | Status: DC | PRN
  Administered 2014-04-24: 50 mg via INTRAVENOUS

## 2014-04-24 MED ORDER — MENTHOL 3 MG MT LOZG
1.0000 | LOZENGE | OROMUCOSAL | Status: DC | PRN
  Filled 2014-04-24: qty 9

## 2014-04-24 SURGICAL SUPPLY — 49 items
APL SKNCLS STERI-STRIP NONHPOA (GAUZE/BANDAGES/DRESSINGS)
BAG SPEC THK2 15X12 ZIP CLS (MISCELLANEOUS) ×1
BAG ZIPLOCK 12X15 (MISCELLANEOUS) ×3 IMPLANT
BENZOIN TINCTURE PRP APPL 2/3 (GAUZE/BANDAGES/DRESSINGS) ×1 IMPLANT
CLEANER TIP ELECTROSURG 2X2 (MISCELLANEOUS) ×3 IMPLANT
DRAIN PENROSE 18X1/4 LTX STRL (WOUND CARE) IMPLANT
DRAPE MICROSCOPE LEICA (MISCELLANEOUS) ×3 IMPLANT
DRAPE POUCH INSTRU U-SHP 10X18 (DRAPES) ×3 IMPLANT
DRAPE SHEET LG 3/4 BI-LAMINATE (DRAPES) ×6 IMPLANT
DRAPE SURG 17X11 SM STRL (DRAPES) ×3 IMPLANT
DRSG ADAPTIC 3X8 NADH LF (GAUZE/BANDAGES/DRESSINGS) ×3 IMPLANT
DRSG PAD ABDOMINAL 8X10 ST (GAUZE/BANDAGES/DRESSINGS) IMPLANT
DURAPREP 26ML APPLICATOR (WOUND CARE) ×3 IMPLANT
ELECT REM PT RETURN 9FT ADLT (ELECTROSURGICAL) ×3
ELECTRODE REM PT RTRN 9FT ADLT (ELECTROSURGICAL) ×1 IMPLANT
GAUZE SPONGE 4X4 12PLY STRL (GAUZE/BANDAGES/DRESSINGS) ×2 IMPLANT
GLOVE BIOGEL PI IND STRL 8 (GLOVE) ×1 IMPLANT
GLOVE BIOGEL PI INDICATOR 8 (GLOVE) ×2
GLOVE ECLIPSE 8.0 STRL XLNG CF (GLOVE) ×9 IMPLANT
GLOVE INDICATOR 8.0 STRL GRN (GLOVE) ×3 IMPLANT
GOWN STRL REUS W/TWL XL LVL3 (GOWN DISPOSABLE) ×6 IMPLANT
KIT BASIN OR (CUSTOM PROCEDURE TRAY) ×3 IMPLANT
KIT POSITIONING SURG ANDREWS (MISCELLANEOUS) ×3 IMPLANT
MANIFOLD NEPTUNE II (INSTRUMENTS) ×3 IMPLANT
NDL SPNL 18GX3.5 QUINCKE PK (NEEDLE) ×2 IMPLANT
NEEDLE HYPO 22GX1.5 SAFETY (NEEDLE) ×3 IMPLANT
NEEDLE SPNL 18GX3.5 QUINCKE PK (NEEDLE) ×9 IMPLANT
NS IRRIG 1000ML POUR BTL (IV SOLUTION) IMPLANT
PACK LAMINECTOMY ORTHO (CUSTOM PROCEDURE TRAY) ×3 IMPLANT
PAD ABD 8X10 STRL (GAUZE/BANDAGES/DRESSINGS) ×4 IMPLANT
PATTIES SURGICAL .5 X.5 (GAUZE/BANDAGES/DRESSINGS) IMPLANT
PATTIES SURGICAL .75X.75 (GAUZE/BANDAGES/DRESSINGS) IMPLANT
PATTIES SURGICAL 1X1 (DISPOSABLE) IMPLANT
PIN SAFETY NICK PLATE  2 MED (MISCELLANEOUS)
PIN SAFETY NICK PLATE 2 MED (MISCELLANEOUS) IMPLANT
POSITIONER SURGICAL ARM (MISCELLANEOUS) ×3 IMPLANT
SPONGE LAP 4X18 X RAY DECT (DISPOSABLE) ×2 IMPLANT
SPONGE SURGIFOAM ABS GEL 100 (HEMOSTASIS) ×3 IMPLANT
STAPLER VISISTAT 35W (STAPLE) ×3 IMPLANT
SUT VIC AB 0 CT1 27 (SUTURE)
SUT VIC AB 0 CT1 27XBRD ANTBC (SUTURE) IMPLANT
SUT VIC AB 1 CT1 27 (SUTURE) ×12
SUT VIC AB 1 CT1 27XBRD ANTBC (SUTURE) ×4 IMPLANT
SYR 20CC LL (SYRINGE) ×6 IMPLANT
TAPE CLOTH SURG 4X10 WHT LF (GAUZE/BANDAGES/DRESSINGS) ×3 IMPLANT
TOWEL OR 17X26 10 PK STRL BLUE (TOWEL DISPOSABLE) ×3 IMPLANT
TOWEL OR NON WOVEN STRL DISP B (DISPOSABLE) ×2 IMPLANT
TRAY FOLEY CATH 14FRSI W/METER (CATHETERS) ×1 IMPLANT
TRAY FOLEY METER SIL LF 16FR (CATHETERS) ×3 IMPLANT

## 2014-04-24 NOTE — Brief Op Note (Signed)
04/24/2014  6:26 PM  PATIENT:  Clemetine Marker Pingley  80 y.o. male  PRE-OPERATIVE DIAGNOSIS:  spinal stenosis,SEVERE at L-3-L-4 with Foraminal Stenosis Of L-3-L-4 Nerve roots.  POST-OPERATIVE DIAGNOSIS:  Same as Pre-Op  PROCEDURE:  Procedure(s): CENTRAL DECOMPRESSION LUMBAR LAMINECTOMYL3-L4 (N/A) and Foraminotomies bilaterally for Foraminal Stenosis.  SURGEON:  Surgeon(s) and Role:    * Tobi Bastos, MD - Primary    * Magnus Sinning, MD - Assisting    ASSISTANTS: Tarri Glenn MD  ANESTHESIA:   general  EBL:  Total I/O In: 1000 [I.V.:1000] Out: 200 [Urine:200]  BLOOD ADMINISTERED:none  DRAINS: none   LOCAL MEDICATIONS USED:  MARCAINE 20cc of 0.25%with Epinephrine at the start of the case and 12cc of Exparel at the end of the case.    SPECIMEN:  No Specimen  DISPOSITION OF SPECIMEN:  N/A  COUNTS:  YES  TOURNIQUET:  * No tourniquets in log *  DICTATION: .Other Dictation: Dictation Number [id number]  PLAN OF CARE: Admit for overnight observation  PATIENT DISPOSITION:  PACU - hemodynamically stable.   Delay start of Pharmacological VTE agent (>24hrs) due to surgical blood loss or risk of bleeding: yes

## 2014-04-24 NOTE — Transfer of Care (Signed)
Immediate Anesthesia Transfer of Care Note  Patient: [name] [name]  Procedure(s) Performed: Procedure(s): CENTRAL DECOMPRESSION LUMBAR LAMINECTOMYL3-L4 (N/A)  Patient Location: PACU  Anesthesia Type:General  Level of Consciousness: awake, alert , oriented and patient cooperative  Airway & Oxygen Therapy: Patient Spontanous Breathing and Patient connected to face mask oxygen  Post-op Assessment: Report given to PACU RN, Post -op Vital signs reviewed and stable and Patient moving all extremities  Post vital signs: Reviewed and stable  Complications: No apparent anesthesia complications

## 2014-04-24 NOTE — Progress Notes (Addendum)
ANTIBIOTIC CONSULT BRIEF NOTE  Pharmacy Consult for:  Vancomycin x 1 dose post-op Indication:  Surgical prophylaxis  Allergies  Allergen Reactions  . Penicillins Swelling    Face only.    Patient Measurements: Height: 5\' 8"  (172.7 cm) Weight: 145 lb (65.772 kg) IBW/kg (Calculated) : 68.4  Vital Signs: Temp: 99 F (37.2 C) (11/12 2040) Temp Source: Oral (11/12 2040) BP: 137/77 mmHg (11/12 2040) Pulse Rate: 73 (11/12 2040)  Labs: The estimated creatinine clearance is 43.5 ml/min, using SCr 1.05 as measured on 04/17/14 and the Cockroft-Gault equation.  Goal of therapy:  Prevention of post-op infection  Dosage appropriate for renal function  Assessment:  Asked to order a post-op dose of Vancomycin for this 80 year-old male who is status post central decompression lumbar laminectomy L3-L4 and L4-L5.  The dose is to be administered 12 hours following the pre-op dose of 1000 mg given at 16:23 today.  The patient's nurse states that no drains were placed during surgery.  Plan:  Vancomycin 500 mg IV x 1 dose at 04:30 11/13.  BrentonPh. 04/24/2014

## 2014-04-24 NOTE — Interval H&P Note (Signed)
History and Physical Interval Note:  04/24/2014 3:51 PM  [name] [name]  has presented today for surgery, with the diagnosis of spinal stenosis   The various methods of treatment have been discussed with the patient and family. After consideration of risks, benefits and other options for treatment, the patient has consented to  Procedure(s): Short Pump,  L4-L5 (2 LEVELS) (N/A) as a surgical intervention .  The patient's history has been reviewed, patient examined, no change in status, stable for surgery.  I have reviewed the patient's chart and labs.  Questions were answered to the patient's satisfaction.     Billie Trager A

## 2014-04-24 NOTE — Anesthesia Preprocedure Evaluation (Addendum)
Anesthesia Evaluation  Patient identified by MRN, date of birth, ID band Patient awake    Reviewed: Allergy & Precautions, H&P , NPO status , Patient's Chart, lab work & pertinent test results  Airway Mallampati: II  TM Distance: >3 FB Neck ROM: Limited    Dental no notable dental hx.    Pulmonary neg pulmonary ROS,  breath sounds clear to auscultation  Pulmonary exam normal       Cardiovascular hypertension, Pt. on medications + dysrhythmias Atrial Fibrillation Rhythm:Regular Rate:Normal     Neuro/Psych negative neurological ROS  negative psych ROS   GI/Hepatic negative GI ROS, Neg liver ROS,   Endo/Other  negative endocrine ROS  Renal/GU negative Renal ROS  negative genitourinary   Musculoskeletal negative musculoskeletal ROS (+)   Abdominal   Peds negative pediatric ROS (+)  Hematology negative hematology ROS (+)   Anesthesia Other Findings   Reproductive/Obstetrics negative OB ROS                            Anesthesia Physical Anesthesia Plan  ASA: III  Anesthesia Plan: General   Post-op Pain Management:    Induction: Intravenous  Airway Management Planned: Oral ETT  Additional Equipment:   Intra-op Plan:   Post-operative Plan: Extubation in OR  Informed Consent: I have reviewed the patients History and Physical, chart, labs and discussed the procedure including the risks, benefits and alternatives for the proposed anesthesia with the patient or authorized representative who has indicated his/her understanding and acceptance.   Dental advisory given  Plan Discussed with: CRNA and Surgeon  Anesthesia Plan Comments:        Anesthesia Quick Evaluation

## 2014-04-25 ENCOUNTER — Encounter (HOSPITAL_COMMUNITY): Payer: Self-pay | Admitting: Orthopedic Surgery

## 2014-04-25 DIAGNOSIS — M4806 Spinal stenosis, lumbar region: Secondary | ICD-10-CM | POA: Diagnosis not present

## 2014-04-25 MED ORDER — OXYCODONE-ACETAMINOPHEN 5-325 MG PO TABS: 1.0000 | ORAL_TABLET | ORAL | Status: DC | PRN

## 2014-04-25 MED ORDER — METHOCARBAMOL 500 MG PO TABS: 500.0000 mg | ORAL_TABLET | Freq: Four times a day (QID) | ORAL | Status: DC | PRN

## 2014-04-25 NOTE — Evaluation (Signed)
Physical Therapy Evaluation Patient Details Name: [name] [name] MRN: [id number] DOB: [birth year] Today's Date: 04/25/2014   History of Present Illness  L3-4 central decompression  Clinical Impression  Pt s/p back surgery presents with functional mobility limitations 2* post op pain, back precautions and premorbid deconditioning.  Pt plans d/c home with 24/7 assist.    Follow Up Recommendations No PT follow up    Equipment Recommendations  None recommended by PT    Recommendations for Other Services OT consult     Precautions / Restrictions Precautions Precautions: Back Precaution Booklet Issued: Yes (comment) Restrictions Weight Bearing Restrictions: No      Mobility  Bed Mobility Overal bed mobility: Needs Assistance Bed Mobility: Supine to Sit     Supine to sit: Min assist     General bed mobility comments: cues for log roll technique  Transfers Overall transfer level: Needs assistance Equipment used: Rolling walker (2 wheeled) Transfers: Sit to/from Stand Sit to Stand: Min assist         General transfer comment: cues for transition position, adherence to back precautions and use of UEs to self assist  Ambulation/Gait Ambulation/Gait assistance: Min assist Ambulation Distance (Feet): 95 Feet Assistive device: Rolling walker (2 wheeled) Gait Pattern/deviations: Step-through pattern;Decreased step length - right;Decreased step length - left;Shuffle;Trunk flexed     General Gait Details: cues for posture, pace and position from [employer]            Wheelchair Mobility    Modified Rankin (Stroke Patients Only)       Balance                                             Pertinent Vitals/Pain Pain Assessment: 0-10 Pain Score: 4  Pain Location: back and L ankle with WB Pain Descriptors / Indicators: Aching;Sore Pain Intervention(s): Limited activity within patient's tolerance;Monitored during session;Premedicated  before session    Home Living Family/patient expects to be discharged to:: Private residence Living Arrangements: Children;Other (Comment) (24/7 assist) Available Help at Discharge: Family;Available 24 hours/day;Personal care attendant Type of Home: House Home Access: Level entry     Home Layout: One level Home Equipment: Walker - 2 wheels;Walker - 4 wheels      Prior Function Level of Independence: Needs assistance         Comments: 24/7 caregivers prior to surgery     Hand Dominance        Extremity/Trunk Assessment   Upper Extremity Assessment: Overall WFL for tasks assessed           Lower Extremity Assessment: LLE deficits/detail   LLE Deficits / Details: Discomfort with WB at L ankle  Cervical / Trunk Assessment: Kyphotic  Communication   Communication: No difficulties  Cognition Arousal/Alertness: Awake/alert Behavior During Therapy: WFL for tasks assessed/performed Overall Cognitive Status: Within Functional Limits for tasks assessed                      General Comments      Exercises        Assessment/Plan    PT Assessment Patient needs continued PT services  PT Diagnosis Difficulty walking   PT Problem List Decreased activity tolerance;Decreased mobility;Decreased knowledge of use of DME;Pain;Decreased knowledge of precautions  PT Treatment Interventions DME instruction;Gait training;Stair training;Functional mobility training;Therapeutic activities;Therapeutic exercise;Patient/family education   PT Goals (Current goals can  be found in the Care Plan section) Acute Rehab PT Goals Patient Stated Goal: Home PT Goal Formulation: With patient Time For Goal Achievement: 04/27/14 Potential to Achieve Goals: Good    Frequency 7X/week   Barriers to discharge        Co-evaluation               End of Session   Activity Tolerance: Patient tolerated treatment well Patient left: in chair;with call bell/phone within reach;with  family/visitor present Nurse Communication: Mobility status    Functional Assessment Tool Used: clinical judgement Functional Limitation: Mobility: Walking and moving around Mobility: Walking and Moving Around Current Status (L9747): At least 20 percent but less than 40 percent impaired, limited or restricted Mobility: Walking and Moving Around Goal Status [phone]): At least 1 percent but less than 20 percent impaired, limited or restricted    Time: 1021-1053 PT Time Calculation (min) (ACUTE ONLY): 32 min   Charges:   PT Evaluation $Initial PT Evaluation Tier I: 1 Procedure PT Treatments $Gait Training: 8-22 mins $Therapeutic Activity: 8-22 mins   PT G Codes:   Functional Assessment Tool Used: clinical judgement Functional Limitation: Mobility: Walking and moving around    [facility] 04/25/2014, 1:23 PM

## 2014-04-25 NOTE — Evaluation (Signed)
Occupational Therapy Evaluation Patient Details Name: [name] [name] MRN: [id number] DOB: 03-13-1922 Today's Date: 04/25/2014    History of Present Illness L3-4 central decompression   Clinical Impression   This 80 year old man was admitted for the above surgery.  Focus of evaluation was for education with pt/family.  They verbalize understanding of precautions and assistance needed for ADLs.      Follow Up Recommendations  No OT follow up    Equipment Recommendations  None recommended by OT    Recommendations for Other Services       Precautions / Restrictions Precautions Precautions: Back Precaution Booklet Issued: Yes (comment) Restrictions Weight Bearing Restrictions: No      Mobility Bed Mobility              Transfers                 Balance                                            ADL Overall ADL's : Needs assistance/impaired                                       General ADL Comments: focus of session was on education.  Pt has 24/7 assistance. Pt needs max A for LB adls.   He likes his privacy and prefers to perform own hygiene.  Educated on toilet aide.  Returned to see if pt wanted to similate this and see if it would work for him, but family has decided that caregivers will assist him for safety/keeping precautions.  They will help with all ADLs and verbalize understanding of precautions and assistance needed     Vision                     Perception     Praxis      Pertinent Vitals/Pain Pt denies pain     Hand Dominance     Extremity/Trunk Assessment Upper Extremity Assessment Upper Extremity Assessment: Overall WFL for tasks assessed      Cervical / Trunk Assessment Cervical / Trunk Assessment: Kyphotic   Communication Communication Communication: No difficulties   Cognition Arousal/Alertness: Awake/alert Behavior During Therapy: WFL for tasks  assessed/performed Overall Cognitive Status: Within Functional Limits for tasks assessed                     General Comments       Exercises       Shoulder Instructions      Home Living Family/patient expects to be discharged to:: Private residence Living Arrangements: Children;Other (Comment) (caregivers) Available Help at Discharge: Family;Available 24 hours/day;Personal care attendant Type of Home: House Home Access: Level entry     Home Layout: One level     Bathroom Shower/Tub: Occupational psychologist: Handicapped height     Home Equipment: Bedside commode;Shower seat   Additional Comments: has all equipment from wife; grab bars and hand held shower      Prior Functioning/Environment Level of Independence: Needs assistance        Comments: 24/7 caregivers prior to surgery.  Pt did bathing sitting on chair; did own hygiene    OT Diagnosis: Generalized weakness   OT Problem List:  OT Treatment/Interventions:      OT Goals(Current goals can be found in the care plan section) Acute Rehab OT Goals Patient Stated Goal: Home  OT Frequency:     Barriers to D/C:            Co-evaluation              End of Session    Activity Tolerance:   Patient left: in bed;with call bell/phone within reach;with restraints reapplied   Time: 1155-1203 OT Time Calculation (min): 8 min Charges:  OT General Charges $OT Visit: 1 Procedure OT Evaluation $Initial OT Evaluation Tier I: 1 Procedure G-Codes: OT G-codes **NOT FOR INPATIENT CLASS** Functional Assessment Tool Used: clinical judgment Functional Limitation: Self care Self Care Current Status (B3419): At least 60 percent but less than 80 percent impaired, limited or restricted Self Care Goal Status (F7902): At least 60 percent but less than 80 percent impaired, limited or restricted Self Care Discharge Status [phone]): At least 60 percent but less than 80 percent impaired, limited or  restricted  Brigetta Beckstrom 04/25/2014, 1:56 PM   Lesle Chris, OTR/L [phone] 04/25/2014

## 2014-04-25 NOTE — Progress Notes (Signed)
Subjective: 1 Day Post-Op Procedure(s) (LRB): CENTRAL DECOMPRESSION LUMBAR LAMINECTOMYL3-L4 (N/A) Patient reports pain as 2 on 0-10 scale.Doing very well today. Will DC    Objective: Vital signs in last 24 hours: Temp:  [97.3 F (36.3 C)-99 F (37.2 C)] 97.5 F (36.4 C) (11/13 0623) Pulse Rate:  [40-86] 73 (11/13 0623) Resp:  [12-20] 14 (11/13 0623) BP: (130-174)/(56-88) 149/81 mmHg (11/13 0623) SpO2:  [96 %-100 %] 98 % (11/13 0623) Weight:  [65.772 kg (145 lb)] 65.772 kg (145 lb) (11/12 1405)  Intake/Output from previous day: 11/12 0701 - 11/13 0700 In: 2297.5 [P.O.:300; I.V.:1942.5; IV Piggyback:55] Out: 905 [Urine:855; Blood:50] Intake/Output this shift: Total I/O In: 1147.5 [P.O.:300; I.V.:792.5; IV Piggyback:55] Out: 655 [Urine:655]  No results for input(s): HGB in the last 72 hours. No results for input(s): WBC, RBC, HCT, PLT in the last 72 hours. No results for input(s): NA, K, CL, CO2, BUN, CREATININE, GLUCOSE, CALCIUM in the last 72 hours. No results for input(s): LABPT, INR in the last 72 hours.  Dorsiflexion/Plantar flexion intact  Assessment/Plan: 1 Day Post-Op Procedure(s) (LRB): CENTRAL DECOMPRESSION LUMBAR LAMINECTOMYL3-L4 (N/A) Up with therapy discontinued Today.  Ruble Pumphrey A 04/25/2014, 6:59 AM

## 2014-04-25 NOTE — Progress Notes (Signed)
Utilization review completed.  

## 2014-04-25 NOTE — Op Note (Signed)
NAMEMarland Kitchen  [name] [name], [name] [name] NO.:  [id number]  MEDICAL RECORD NO.:  [id number]  LOCATION:  [id number]                         FACILITY:  [facility]  PHYSICIAN:  Kipp Brood. Rosaleah Person, M.D.DATE OF BIRTH:  02-14-1922  DATE OF PROCEDURE:  04/24/2014 DATE OF DISCHARGE:                              OPERATIVE REPORT   SURGEON:  Kipp Brood. Gladstone Lighter, M.D.  ASSISTANT:  Tarri Glenn, M.D.  PREOPERATIVE DIAGNOSES: 1. Severe spinal stenosis at L3-4. 2. Severe left leg pain with partial footdrop on the left.  He also     had some weakness of his left lower extremity.  POSTOPERATIVE DIAGNOSES: 1. Severe spinal stenosis at L3-4. 2. Severe left leg pain with partial footdrop on the left.  He also     had some weakness of his left lower extremity.  OPERATION: 1. Complete decompressive lumbar laminectomy, utilizing the microscope     at L3-L4. 2. Foraminotomies for the L3 and the L4 roots.  DESCRIPTION OF PROCEDURE:  Under general anesthesia, routine orthopedic prep and draping of the lower back was carried out.  The appropriate time-out was first carried out.  I also marked the appropriate left side of the back in the holding area, even though we were going centrally, because his symptoms were all on the left.  He had 1 g of vancomycin because he was allergic to penicillin.  At this time, 2 needles were placed in the back after the time-out.  X-ray was taken to verify the position.  We then made an incision over the L3-4 interspace.  We extended the incision proximally and distally.  We then stripped the muscle from the lamina and spinous processes bilaterally.  An x-ray was taken again.  Following that, we went down and inserted the [facility] retractors.  We then did our central decompressive lumbar laminectomy in the usual fashion.  We went out far laterally into the recesses, especially on the left where he had severe compression.  We gently protected the dura at all times.  We removed  the ligamentum flavum.  We went distally and proximally with the hockey-stick to make sure we were free.  We also went out the foramina with the hockey-stick to make sure we had good freedom of the nerve root.  Note, we explored the disk, it was a hard disk.  We noted when we completely opened up that lateral recess on the left, the dura literally was beating like a heart beat. We had good freedom of the roots and the dura.  We did not feel that any further procedure was necessary.  The roots were definitely free.  We thoroughly irrigated out the area and loosely applied some thrombin- soaked Gelfoam, and I closed the wound in layers in usual fashion except I left the small deep distal proximal part of the wound open for drainage purposes.  At the beginning of procedure, I injected 20 mL of 0.25% Marcaine with epinephrine into the soft tissue to prevent bleeding.  At the end of the procedure, I injected 12 mL of Exparel into the soft tissue.  The skin was closed with metal staples.  Sterile Neosporin dressing were applied.  ______________________________ Kipp Brood Gladstone Lighter, M.D.     RAG/MEDQ  D:  04/24/2014  T:  04/25/2014  Job:  [id number]

## 2014-04-25 NOTE — Discharge Instructions (Signed)
Change your dressing daily. Shower only, no tub bath. For the first three days, remove dressing and place saran wrap over the incision while showering Place new dressing after showering. Call if any temperatures greater than 101 or any wound complications: [phone] during the day and ask for Dr. Charlestine Night nurse, Brunilda Payor.

## 2014-04-28 ENCOUNTER — Encounter (HOSPITAL_COMMUNITY): Payer: Medicare Other

## 2014-04-28 NOTE — Anesthesia Postprocedure Evaluation (Signed)
  Anesthesia Post-op Note  Patient: [name] [name]  Procedure(s) Performed: Procedure(s) (LRB): CENTRAL DECOMPRESSION LUMBAR LAMINECTOMYL3-L4 (N/A)  Patient Location: PACU  Anesthesia Type: General  Level of Consciousness: awake and alert   Airway and Oxygen Therapy: Patient Spontanous Breathing  Post-op Pain: mild  Post-op Assessment: Post-op Vital signs reviewed, Patient's Cardiovascular Status Stable, Respiratory Function Stable, Patent Airway and No signs of Nausea or vomiting  Last Vitals:  Filed Vitals:   04/25/14 1346  BP: 135/69  Pulse: 63  Temp: 36.4 C  Resp: 14    Post-op Vital Signs: stable   Complications: No apparent anesthesia complications

## 2014-04-29 NOTE — Discharge Summary (Signed)
Physician Discharge Summary   Patient ID: [name] [name] MRN: [id number] DOB/AGE: [birth year] 80 y.o.  Admit date: 04/24/2014 Discharge date: 04/25/2014  Primary Diagnosis: Lumbar spinal stenosis   Admission Diagnoses:  Past Medical History  Diagnosis Date  . Atrial fibrillation   . Long term (current) use of anticoagulants     Coumadin  . Occlusion and stenosis of carotid artery without mention of cerebral infarction   . Unspecified essential hypertension   . Elevated prostate specific antigen (PSA)   . Polymyalgia rheumatica   . Macular degeneration     PT STILL ABLE TO SEE TO READ  . Epistaxis   . Lower extremity weakness     2 PREVIOUS HIP FRACTURES  . Lacunar stroke     Histiry of small right MCA Lacunar Stroke  . Peripheral edema   . Lumbar stenosis     SEVERE BACK  AND LEFT LEG PAIN WITH LEFT FOOT DROP  . Carcinomas, basal cell     NOSE  . Fall Jul 06, 2011    FELL AND FRACTURED COLLAR BONE AND FRACTURED 3 RD CERVICAL VERTEBRAE - NO SURGERY  . Vertigo SPRING 2015    FELL -BURSA LEFT HIP SINCE THE FALL - SORE.  Marland Kitchen Dysrhythmia     AF- DR. Liane [name] IS PT'S CARDIOLOGIST - BLOOD THINNER WAS D/C IN AUGUST 2015 BY HIS CARDIOLGIST BECAUSE HX OF FALLS   Discharge Diagnoses:   Active Problems:   Spinal stenosis, lumbar region, with neurogenic claudication  Estimated body mass index is 22.05 kg/(m^2) as calculated from the following:   Height as of this encounter: '5\' 8"'  (1.727 m).   Weight as of this encounter: 65.772 kg (145 lb).  Procedure:  Procedure(s) (LRB): CENTRAL DECOMPRESSION LUMBAR LAMINECTOMYL3-L4 (N/A)   Consults: None  HPI: The patient is a 80 year old male who presents with back pain. The patient reports low back symptoms including low back pain which began in May without any known iHe fell in the shower secondary to vertigo. He did not have any major injuries then, but he did see Dr. Alvan [name] for his hip. He didn't have this back pain or weakness in his  left leg. In the past month he developed the problem with the weakness in his leg and severe back pain on the right.  Laboratory Data: Hospital Outpatient Visit on 04/17/2014  Component Date Value Ref Range Status  . MRSA, PCR 04/17/2014 NEGATIVE  NEGATIVE Final  . Staphylococcus aureus 04/17/2014 NEGATIVE  NEGATIVE Final   Comment:        The Xpert SA Assay (FDA approved for NASAL specimens in patients over 31 years of age), is one component of a comprehensive surveillance program.  Test performance has been validated by [employer] for patients greater than or equal to 21 year old. It is not intended to diagnose infection nor to guide or monitor treatment.   . WBC 04/17/2014 7.5  4.0 - 10.5 K/uL Final  . RBC 04/17/2014 3.93* 4.22 - 5.81 MIL/uL Final  . Hemoglobin 04/17/2014 12.7* 13.0 - 17.0 g/dL Final  . HCT 04/17/2014 39.5  39.0 - 52.0 % Final  . MCV 04/17/2014 100.5* 78.0 - 100.0 fL Final  . MCH 04/17/2014 32.3  26.0 - 34.0 pg Final  . MCHC 04/17/2014 32.2  30.0 - 36.0 g/dL Final  . RDW 04/17/2014 14.5  11.5 - 15.5 % Final  . Platelets 04/17/2014 179  150 - 400 K/uL Final  . Sodium 04/17/2014 140  137 - 147 mEq/L Final  . Potassium 04/17/2014 5.2  3.7 - 5.3 mEq/L Final  . Chloride 04/17/2014 105  96 - 112 mEq/L Final  . CO2 04/17/2014 23  19 - 32 mEq/L Final  . Glucose, Bld 04/17/2014 89  70 - 99 mg/dL Final  . BUN 04/17/2014 23  6 - 23 mg/dL Final  . Creatinine, Ser 04/17/2014 1.05  0.50 - 1.35 mg/dL Final  . Calcium 04/17/2014 9.3  8.4 - 10.5 mg/dL Final  . Total Protein 04/17/2014 7.3  6.0 - 8.3 g/dL Final  . Albumin 04/17/2014 3.6  3.5 - 5.2 g/dL Final  . AST 04/17/2014 17  0 - 37 U/L Final   Comment: SLIGHT HEMOLYSIS HEMOLYSIS AT THIS LEVEL MAY AFFECT RESULT   . ALT 04/17/2014 8  0 - 53 U/L Final  . Alkaline Phosphatase 04/17/2014 117  39 - 117 U/L Final  . Total Bilirubin 04/17/2014 0.3  0.3 - 1.2 mg/dL Final  . GFR calc non Af Amer 04/17/2014 60* >90  mL/min Final  . GFR calc Af Amer 04/17/2014 70* >90 mL/min Final   Comment: (NOTE) The eGFR has been calculated using the CKD EPI equation. This calculation has not been validated in all clinical situations. eGFR's persistently <90 mL/min signify possible Chronic Kidney Disease.   . Anion gap 04/17/2014 12  5 - 15 Final  . Prothrombin Time 04/17/2014 14.7  11.6 - 15.2 seconds Final  . INR 04/17/2014 1.14  0.00 - 1.49 Final  . Color, Urine 04/17/2014 [name] [name]* YELLOW Final   BIOCHEMICALS MAY BE AFFECTED BY COLOR  . APPearance 04/17/2014 CLEAR  CLEAR Final  . Specific Gravity, Urine 04/17/2014 1.031* 1.005 - 1.030 Final  . pH 04/17/2014 5.0  5.0 - 8.0 Final  . Glucose, UA 04/17/2014 NEGATIVE  NEGATIVE mg/dL Final  . Hgb urine dipstick 04/17/2014 NEGATIVE  NEGATIVE Final  . Bilirubin Urine 04/17/2014 NEGATIVE  NEGATIVE Final  . Ketones, ur 04/17/2014 NEGATIVE  NEGATIVE mg/dL Final  . Protein, ur 04/17/2014 NEGATIVE  NEGATIVE mg/dL Final  . Urobilinogen, UA 04/17/2014 1.0  0.0 - 1.0 mg/dL Final  . Nitrite 04/17/2014 NEGATIVE  NEGATIVE Final  . Leukocytes, UA 04/17/2014 NEGATIVE  NEGATIVE Final   MICROSCOPIC NOT DONE ON URINES WITH NEGATIVE PROTEIN, BLOOD, LEUKOCYTES, NITRITE, OR GLUCOSE <1000 mg/dL.     X-Rays:Dg Lumbar Spine 2-3 Views  04/17/2014   CLINICAL DATA:  Preoperative examination prior to lumbar surgery  EXAM: LUMBAR SPINE - 2-3 VIEW  COMPARISON:  Lumbar spine CT of March 24, 2014.  FINDINGS: The lumbar vertebral bodies are preserved in height. There is mild disc space narrowing at all lumbar levels with exception of L5-S1. There are anterior endplate osteophytes at T12-L1, L1-L2, L2-L3, and L4-5. There is a large left lateral bridging osteophyte at L3-4. There is mild facet joint hypertrophy.  IMPRESSION: There is degenerative disc change at multiple lumbar levels.   Electronically Signed   By: David  Martinique   On: 04/17/2014 16:23   Dg Spine Portable 1 View  04/24/2014    CLINICAL DATA:  Surgical decompression at the L3-4 and L4-5 levels.  EXAM: PORTABLE SPINE - 1 VIEW  COMPARISON:  Previous examinations, including earlier today and on 04/17/2014.  FINDINGS: The last open disc space is again labeled the L5-S1 level. Metallic localizer with its tip projected over the pedicles at the L4 level. Multilevel degenerative changes. Atheromatous arterial calcifications.  IMPRESSION: Localizer at the L4 level.   Electronically Signed   By:  Enrique Sack M.D.   On: 04/24/2014 17:49   Dg Spine Portable 1 View  04/24/2014   CLINICAL DATA:  Lumbar laminectomy/ decompression micro discectomy at L3-4 and L4-5.  EXAM: PORTABLE SPINE - 1 VIEW  COMPARISON:  None.  FINDINGS: I use the same numbering as on the prior radiograph from 04/24/2014, and as on the lumbar myelogram from 03/24/2014.  Lateral radiograph labeled #2 demonstrates a is flat spatula type probe oriented towards the L4 pedicles and superior endplate of L4, and a threaded probe slightly more cephalad oriented towards the L3-4 intervertebral level.  Multilevel bridging spurs in the lumbar spine. Reticulonodular interstitial opacity projects over the lower lobes.  IMPRESSION: 1. Intraoperative lumbar spine localization, with the threaded probe oriented at L3-4 and the flat spatula shaped probe oriented towards the L4 pedicles.   Electronically Signed   By: Sherryl Barters M.D.   On: 04/24/2014 17:26   Dg Spine Portable 1 View  04/24/2014   CLINICAL DATA:  Low more laminectomy at L3-4 and L4-5  EXAM: PORTABLE SPINE - 1 VIEW  COMPARISON:  04/17/2014  FINDINGS: Two needles are noted in the posterior soft tissues at the L3-4 and L4-5 levels. The numbering nomenclature is similar to that used on the prior plain film examination. Multilevel degenerative changes are again seen.  IMPRESSION: Intraoperative lumbar localization.   Electronically Signed   By: Inez Catalina M.D.   On: 04/24/2014 17:14    EKG: Orders placed or performed in  visit on 02/27/14  . EKG 12-Lead     Hospital Course: [name] [name] is a 80 y.o. who was admitted to [facility]. They were brought to the operating room on 04/24/2014 and underwent Procedure(s): [place].  Patient tolerated the procedure well and was later transferred to the recovery room and then to the orthopaedic floor for postoperative care.  They were given PO and IV analgesics for pain control following their surgery.  They were given 24 hours of postoperative antibiotics of  Anti-infectives    Start     Dose/Rate Route Frequency Ordered Stop   04/25/14 0600  vancomycin (VANCOCIN) IVPB 1000 mg/200 mL premix     1,000 mg200 mL/hr over 60 Minutes Intravenous On call to O.R. 04/24/14 1520 04/24/14 1623   04/25/14 0430  vancomycin (VANCOCIN) 500 mg in sodium chloride 0.9 % 100 mL IVPB     500 mg100 mL/hr over 60 Minutes Intravenous  Once 04/24/14 2139 04/25/14 0611   04/24/14 1715  polymyxin B 500,000 Units, bacitracin 50,000 Units in sodium chloride irrigation 0.9 % 500 mL irrigation  Status:  Discontinued       As needed 04/24/14 1715 04/24/14 1832    PT was ordered to walk with the patient.Discharge planning consulted to help with postop disposition and equipment needs.  Patient had a good night on the evening of surgery.  They started to get up OOB with therapy on day one.   Patient was seen in rounds and was ready to go home.   Diet: Cardiac diet Activity:WBAT Follow-up:in 2 weeks Disposition - Home Discharged Condition: stable   Discharge Instructions    Call MD / Call 911    Complete by:  As directed   If you experience chest pain or shortness of breath, CALL 911 and be transported to the hospital emergency room.  If you develope a fever above 101 F, pus (white drainage) or increased drainage or redness at the wound, or calf pain,  call your surgeon's office.     Constipation Prevention    Complete by:  As directed   Drink  plenty of fluids.  Prune juice may be helpful.  You may use a stool softener, such as Colace (over the counter) 100 mg twice a day.  Use MiraLax (over the counter) for constipation as needed.     Diet - low sodium heart healthy    Complete by:  As directed      Discharge instructions    Complete by:  As directed   Change your dressing daily. Shower only, no tub bath. For the first three days, remove dressing and place saran wrap over the incision while showering Place new dressing after showering. Call if any temperatures greater than 101 or any wound complications: [phone] during the day and ask for Dr. Charlestine Night nurse, Brunilda Payor.     Driving restrictions    Complete by:  As directed   No driving     Increase activity slowly as tolerated    Complete by:  As directed      Lifting restrictions    Complete by:  As directed   No lifting            Medication List    STOP taking these medications        acetaminophen 500 MG tablet  Commonly known as:  TYLENOL     HYDROcodone-acetaminophen 5-325 MG per tablet  Commonly known as:  NORCO/VICODIN      TAKE these medications        aspirin EC 325 MG tablet  Take 1 tablet (325 mg total) by mouth daily.     benazepril 5 MG tablet  Commonly known as:  LOTENSIN  Take 1 tablet (5 mg total) by mouth daily.     diltiazem 240 MG 24 hr capsule  Commonly known as:  CARDIZEM CD  Take 240 mg by mouth every morning.     methocarbamol 500 MG tablet  Commonly known as:  ROBAXIN  Take 1 tablet (500 mg total) by mouth every 6 (six) hours as needed for muscle spasms.     PRESERVISION AREDS 2 PO  Take 1 tablet by mouth every morning.     multivitamin with minerals tablet  Take 1 tablet by mouth every morning. Preservision.     OVER THE COUNTER MEDICATION  Place 1 drop into both eyes. Saline Eye Drops.  BID BILATERAL     oxyCODONE-acetaminophen 5-325 MG per tablet  Commonly known as:  PERCOCET/ROXICET  Take 1-2 tablets by mouth  every 4 (four) hours as needed for moderate pain.     vitamin B-12 1000 MCG tablet  Commonly known as:  CYANOCOBALAMIN  Take 1,000 mcg by mouth every morning.           Follow-up Information    Follow up with GIOFFRE,RONALD A, MD. Schedule an appointment as soon as possible for a visit in 2 weeks.   Specialty:  Orthopedic Surgery   Contact information:   [address] [place] [address] [phone]       Signed: Ardeen Jourdain, PA-C Orthopaedic Surgery 04/29/2014, 8:37 AM

## 2014-05-01 ENCOUNTER — Ambulatory Visit: Payer: Medicare Other | Admitting: Cardiology

## 2014-05-19 ENCOUNTER — Ambulatory Visit (HOSPITAL_COMMUNITY): Payer: Medicare Other | Attending: Cardiology | Admitting: Cardiology

## 2014-05-19 DIAGNOSIS — I482 Chronic atrial fibrillation, unspecified: Secondary | ICD-10-CM

## 2014-05-19 DIAGNOSIS — I6523 Occlusion and stenosis of bilateral carotid arteries: Secondary | ICD-10-CM

## 2014-05-19 NOTE — Progress Notes (Signed)
Carotid duplex performed 

## 2014-05-26 ENCOUNTER — Ambulatory Visit (INDEPENDENT_AMBULATORY_CARE_PROVIDER_SITE_OTHER): Payer: Medicare Other | Admitting: Cardiology

## 2014-05-26 ENCOUNTER — Encounter: Payer: Self-pay | Admitting: Cardiology

## 2014-05-26 VITALS — BP 130/68 | HR 70 | Ht 68.0 in | Wt 144.0 lb

## 2014-05-26 DIAGNOSIS — I481 Persistent atrial fibrillation: Secondary | ICD-10-CM

## 2014-05-26 DIAGNOSIS — R600 Localized edema: Secondary | ICD-10-CM

## 2014-05-26 DIAGNOSIS — I10 Essential (primary) hypertension: Secondary | ICD-10-CM

## 2014-05-26 DIAGNOSIS — I4819 Other persistent atrial fibrillation: Secondary | ICD-10-CM
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
## 2014-05-26 NOTE — Patient Instructions (Signed)
Your physician wants you to follow-up in: 4 months. You will receive a reminder letter in the mail two months in advance. If you don't receive a letter, please call our office to schedule the follow-up appointment.  

## 2014-09-23 ENCOUNTER — Encounter: Payer: Self-pay | Admitting: Cardiology

## 2014-09-23 ENCOUNTER — Ambulatory Visit (INDEPENDENT_AMBULATORY_CARE_PROVIDER_SITE_OTHER): Payer: Medicare Other | Admitting: Cardiology

## 2014-09-23 VITALS — BP 116/62 | HR 62 | Ht 68.0 in | Wt 144.0 lb

## 2014-09-23 DIAGNOSIS — I481 Persistent atrial fibrillation: Secondary | ICD-10-CM

## 2014-09-23 DIAGNOSIS — I10 Essential (primary) hypertension: Secondary | ICD-10-CM

## 2014-09-23 DIAGNOSIS — I4819 Other persistent atrial fibrillation: Secondary | ICD-10-CM
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
## 2014-09-23 NOTE — Patient Instructions (Signed)
Your physician recommends that you continue on your current medications as directed. Please refer to the Current Medication list given to you today.     Your physician wants you to follow-up in: 6 MONTHS WITH DR NELSON You will receive a reminder letter in the mail two months in advance. If you don't receive a letter, please call our office to schedule the follow-up appointment.  

## 2014-12-20 ENCOUNTER — Emergency Department (HOSPITAL_COMMUNITY)
Admission: EM | Admit: 2014-12-20 | Discharge: 2014-12-20 | Disposition: A | Discharge status: Home / Self Care | Payer: Medicare Other | Attending: Emergency Medicine | Admitting: Emergency Medicine

## 2014-12-20 ENCOUNTER — Encounter (HOSPITAL_COMMUNITY): Payer: Self-pay

## 2014-12-20 ENCOUNTER — Emergency Department (HOSPITAL_COMMUNITY): Payer: Medicare Other

## 2014-12-20 DIAGNOSIS — Z79899 Other long term (current) drug therapy: Secondary | ICD-10-CM | POA: Insufficient documentation

## 2014-12-20 DIAGNOSIS — I10 Essential (primary) hypertension: Secondary | ICD-10-CM | POA: Insufficient documentation

## 2014-12-20 DIAGNOSIS — Z7982 Long term (current) use of aspirin: Secondary | ICD-10-CM | POA: Diagnosis not present

## 2014-12-20 DIAGNOSIS — R531 Weakness: Secondary | ICD-10-CM | POA: Insufficient documentation

## 2014-12-20 DIAGNOSIS — Z9889 Other specified postprocedural states: Secondary | ICD-10-CM | POA: Diagnosis not present

## 2014-12-20 DIAGNOSIS — M549 Dorsalgia, unspecified: Secondary | ICD-10-CM | POA: Insufficient documentation

## 2014-12-20 DIAGNOSIS — Z880 Allergy status to penicillin: Secondary | ICD-10-CM | POA: Insufficient documentation

## 2014-12-20 DIAGNOSIS — Z7901 Long term (current) use of anticoagulants: Secondary | ICD-10-CM | POA: Insufficient documentation

## 2014-12-20 DIAGNOSIS — I4891 Unspecified atrial fibrillation: Secondary | ICD-10-CM | POA: Diagnosis not present

## 2014-12-20 DIAGNOSIS — Z8669 Personal history of other diseases of the nervous system and sense organs: Secondary | ICD-10-CM | POA: Insufficient documentation

## 2014-12-20 DIAGNOSIS — Z8673 Personal history of transient ischemic attack (TIA), and cerebral infarction without residual deficits: Secondary | ICD-10-CM | POA: Diagnosis not present

## 2014-12-20 DIAGNOSIS — M25552 Pain in left hip: Secondary | ICD-10-CM | POA: Diagnosis not present

## 2014-12-20 MED ORDER — HYDROCODONE-ACETAMINOPHEN 5-325 MG PO TABS
1.0000 | ORAL_TABLET | Freq: Once | ORAL | Status: AC
  Administered 2014-12-20: 1 via ORAL
  Filled 2014-12-20: qty 1

## 2014-12-20 MED ORDER — HYDROCODONE-ACETAMINOPHEN 7.5-325 MG/15ML PO SOLN: 10.0000 mL | Freq: Once | ORAL | Status: DC

## 2014-12-20 NOTE — Discharge Instructions (Signed)
Hip Pain Your hip is the joint between your upper legs and your lower pelvis. The bones, cartilage, tendons, and muscles of your hip joint perform a lot of work each day supporting your body weight and allowing you to move around. Hip pain can range from a minor ache to severe pain in one or both of your hips. Pain may be felt on the inside of the hip joint near the groin, or the outside near the buttocks and upper thigh. You may have swelling or stiffness as well.  HOME CARE INSTRUCTIONS   Take medicines only as directed by your health care provider.  Apply ice to the injured area:  Put ice in a plastic bag.  Place a towel between your skin and the bag.  Leave the ice on for 15-20 minutes at a time, 3-4 times a day.  Keep your leg raised (elevated) when possible to lessen swelling.  Avoid activities that cause pain.  Follow specific exercises as directed by your health care provider.  Sleep with a pillow between your legs on your most comfortable side.  Record how often you have hip pain, the location of the pain, and what it feels like. SEEK MEDICAL CARE IF:   You are unable to put weight on your leg.  Your hip is red or swollen or very tender to touch.  Your pain or swelling continues or worsens after 1 week.  You have increasing difficulty walking.  You have a fever. SEEK IMMEDIATE MEDICAL CARE IF:   You have fallen.  You have a sudden increase in pain and swelling in your hip. MAKE SURE YOU:   Understand these instructions.  Will watch your condition.  Will get help right away if you are not doing well or get worse. Document Released: 11/17/2009 Document Revised: 10/14/2013 Document Reviewed: 01/24/2013 Exit[employer] Patient Information 2015 [employer]. This information is not intended to replace advice given to you by your health care provider. Make sure you discuss any questions you have with your health care provider.  
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
## 2014-12-20 NOTE — ED Notes (Signed)
Per EMS report.  Pt lives at home. Has in-home care.  Has had LT hip pain, spontaneous approx x 3 days.  Denies any injury, no shortening or rotation per ems.  Was able to stand and bear wt to RT side to get on EMS stretcher without severe pain.  EMS vitals 159/69,61,20,99% RA, CBG 130.

## 2014-12-20 NOTE — ED Notes (Signed)
Bed: BV13 Expected date:  Expected time:  Means of arrival:  Comments: 81 y/o M hip pain

## 2015-01-09 ENCOUNTER — Telehealth: Payer: Self-pay | Admitting: Cardiology

## 2015-01-09 NOTE — Telephone Encounter (Signed)
Pt's daughter calling wanting to know if we received a surgical clearance request form from [place].  I don't see anything in the chart that we have received anything from [place]. Have you received a request?  From what I understand from the daughter it is to take ortho hardware out.

## 2015-01-09 NOTE — Telephone Encounter (Signed)
New Message       Pt's daughter calling wanting to know if we received a surgical clearance form from [place]. Please call back and advise.

## 2015-01-09 NOTE — Telephone Encounter (Signed)
Ivy, could you please check that we have received this surg clearance form? Thank you, Dorothy Spark

## 2015-01-13 NOTE — Telephone Encounter (Signed)
Will fax surgical clearance note to [place] attention Pollyann Savoy at [phone] that per Dr Meda Coffee the pt is cleared for surgery with no further work-up needed.  Surgery the pt will be receiving is left hip-removal of troch nail and distal screw left hip/femur.

## 2015-01-15 ENCOUNTER — Other Ambulatory Visit: Payer: Self-pay | Admitting: Surgical

## 2015-01-15 MED ORDER — BUPIVACAINE LIPOSOME 1.3 % IJ SUSP: 20.0000 mL | Freq: Once | INTRAMUSCULAR | Status: AC

## 2015-01-28 ENCOUNTER — Other Ambulatory Visit (HOSPITAL_COMMUNITY): Payer: Self-pay | Admitting: *Deleted

## 2015-01-28 NOTE — Patient Instructions (Addendum)
YOUR PROCEDURE IS SCHEDULED ON :  02/10/15  REPORT TO [facility] MAIN ENTRANCE FOLLOW SIGNS TO EAST ELEVATOR - GO TO 3rd FLOOR CHECK IN AT 3 EAST NURSES STATION (SHORT STAY) AT:  10:30 AM  CALL THIS NUMBER IF YOU HAVE PROBLEMS THE MORNING OF SURGERY [phone]  REMEMBER:ONLY 1 PER PERSON MAY GO TO SHORT STAY WITH YOU TO GET READY THE MORNING OF YOUR SURGERY  DO NOT EAT FOOD OR DRINK LIQUIDS AFTER MIDNIGHT  TAKE THESE MEDICINES THE MORNING OF SURGERY: DILTIAZEM / MAY HAVE HYDROCODONE IF NEEDED  YOU MAY NOT HAVE ANY METAL ON YOUR BODY INCLUDING HAIR PINS AND PIERCING'S. DO NOT WEAR JEWELRY, MAKEUP, LOTIONS, POWDERS OR PERFUMES. DO NOT WEAR NAIL POLISH. DO NOT SHAVE 48 HRS PRIOR TO SURGERY. MEN MAY SHAVE FACE AND NECK.  DO NOT [place]. [facility] [facility] IS NOT RESPONSIBLE FOR VALUABLES.  CONTACTS, DENTURES OR PARTIALS MAY NOT BE WORN TO SURGERY. LEAVE SUITCASE IN CAR. CAN BE BROUGHT TO ROOM AFTER SURGERY.  PATIENTS DISCHARGED THE DAY OF SURGERY WILL NOT BE ALLOWED TO DRIVE HOME.  PLEASE READ OVER THE FOLLOWING INSTRUCTION SHEETS _________________________________________________________________________________                                          [place] - PREPARING FOR SURGERY  Before surgery, you can play an important role.  Because skin is not sterile, your skin needs to be as free of germs as possible.  You can reduce the number of germs on your skin by washing with CHG (chlorahexidine gluconate) soap before surgery.  CHG is an antiseptic cleaner which kills germs and bonds with the skin to continue killing germs even after washing. Please DO NOT use if you have an allergy to CHG or antibacterial soaps.  If your skin becomes reddened/irritated stop using the CHG and inform your nurse when you arrive at Short Stay. Do not shave (including legs and underarms) for at least 48 hours prior to the first CHG shower.  You may shave your face. Please  follow these instructions carefully:   1.  Shower with CHG Soap the night before surgery and the  morning of Surgery.   2.  If you choose to wash your hair, wash your hair first as usual with your  normal  Shampoo.   3.  After you shampoo, rinse your hair and body thoroughly to remove the  shampoo.                                         4.  Use CHG as you would any other liquid soap.  You can apply chg directly  to the skin and wash . Gently wash with scrungie or clean wascloth    5.  Apply the CHG Soap to your body ONLY FROM THE NECK DOWN.   Do not use on open                           Wound or open sores. Avoid contact with eyes, ears mouth and genitals (private parts).                        Genitals (private parts)  with your normal soap.              6.  Wash thoroughly, paying special attention to the area where your surgery  will be performed.   7.  Thoroughly rinse your body with warm water from the neck down.   8.  DO NOT shower/wash with your normal soap after using and rinsing off  the CHG Soap .                9.  Pat yourself dry with a clean towel.             10.  Wear clean night clothes to bed after shower             11.  Place clean sheets on your bed the night of your first shower and do not  sleep with pets.  Day of Surgery : Do not apply any lotions/deodorants the morning of surgery.  Please wear clean clothes to the hospital/surgery center.  FAILURE TO FOLLOW THESE INSTRUCTIONS MAY RESULT IN THE CANCELLATION OF YOUR SURGERY    PATIENT SIGNATURE_________________________________  ______________________________________________________________________     [name] [name]  An incentive spirometer is a tool that can help keep your lungs clear and active. This tool measures how well you are filling your lungs with each breath. Taking long deep breaths may help reverse or decrease the chance of developing breathing (pulmonary) problems (especially  infection) following:  A long period of time when you are unable to move or be active. BEFORE THE PROCEDURE   If the spirometer includes an indicator to show your best effort, your nurse or respiratory therapist will set it to a desired goal.  If possible, sit up straight or lean slightly forward. Try not to slouch.  Hold the incentive spirometer in an upright position. INSTRUCTIONS FOR USE   Sit on the edge of your bed if possible, or sit up as far as you can in bed or on a chair.  Hold the incentive spirometer in an upright position.  Breathe out normally.  Place the mouthpiece in your mouth and seal your lips tightly around it.  Breathe in slowly and as deeply as possible, raising the piston or the ball toward the top of the column.  Hold your breath for 3-5 seconds or for as long as possible. Allow the piston or ball to fall to the bottom of the column.  Remove the mouthpiece from your mouth and breathe out normally.  Rest for a few seconds and repeat Steps 1 through 7 at least 10 times every 1-2 hours when you are awake. Take your time and take a few normal breaths between deep breaths.  The spirometer may include an indicator to show your best effort. Use the indicator as a goal to work toward during each repetition.  After each set of 10 deep breaths, practice coughing to be sure your lungs are clear. If you have an incision (the cut made at the time of surgery), support your incision when coughing by placing a pillow or rolled up towels firmly against it. Once you are able to get out of bed, walk around indoors and cough well. You may stop using the incentive spirometer when instructed by your caregiver.  RISKS AND COMPLICATIONS  Take your time so you do not get dizzy or light-headed.  If you are in pain, you may need to take or ask for pain medication before doing incentive spirometry. It is harder to take  a deep breath if you are having pain. AFTER USE  Rest and  breathe slowly and easily.  It can be helpful to keep track of a log of your progress. Your caregiver can provide you with a simple table to help with this. If you are using the spirometer at home, follow these instructions: [place] IF:   You are having difficultly using the spirometer.  You have trouble using the spirometer as often as instructed.  Your pain medication is not giving enough relief while using the spirometer.  You develop fever of 100.5 F (38.1 C) or higher. SEEK IMMEDIATE MEDICAL CARE IF:   You cough up bloody sputum that had not been present before.  You develop fever of 102 F (38.9 C) or greater.  You develop worsening pain at or near the incision site. MAKE SURE YOU:   Understand these instructions.  Will watch your condition.  Will get help right away if you are not doing well or get worse. Document Released: 10/10/2006 Document Revised: 08/22/2011 Document Reviewed: 12/11/2006 Exit[employer] Patient Information 2014 [employer], [place].   ________________________________________________________________________  WHAT IS A BLOOD TRANSFUSION? Blood Transfusion Information  A transfusion is the replacement of blood or some of its parts. Blood is made up of multiple cells which provide different functions.  Red blood cells carry oxygen and are used for blood loss replacement.  White blood cells fight against infection.  Platelets control bleeding.  Plasma helps clot blood.  Other blood products are available for specialized needs, such as hemophilia or other clotting disorders. BEFORE THE TRANSFUSION  Who gives blood for transfusions?   Healthy volunteers who are fully evaluated to make sure their blood is safe. This is blood bank blood. Transfusion therapy is the safest it has ever been in the practice of medicine. Before blood is taken from a donor, a complete history is taken to make sure that person has no history of diseases nor engages in  risky social behavior (examples are intravenous drug use or sexual activity with multiple partners). The donor's travel history is screened to minimize risk of transmitting infections, such as malaria. The donated blood is tested for signs of infectious diseases, such as HIV and hepatitis. The blood is then tested to be sure it is compatible with you in order to minimize the chance of a transfusion reaction. If you or a relative donates blood, this is often done in anticipation of surgery and is not appropriate for emergency situations. It takes many days to process the donated blood. RISKS AND COMPLICATIONS Although transfusion therapy is very safe and saves many lives, the main dangers of transfusion include:   Getting an infectious disease.  Developing a transfusion reaction. This is an allergic reaction to something in the blood you were given. Every precaution is taken to prevent this. The decision to have a blood transfusion has been considered carefully by your caregiver before blood is given. Blood is not given unless the benefits outweigh the risks. AFTER THE TRANSFUSION  Right after receiving a blood transfusion, you will usually feel much better and more energetic. This is especially true if your red blood cells have gotten low (anemic). The transfusion raises the level of the red blood cells which carry oxygen, and this usually causes an energy increase.  The nurse administering the transfusion will monitor you carefully for complications. HOME CARE INSTRUCTIONS  No special instructions are needed after a transfusion. You may find your energy is better. Speak with your caregiver  about any limitations on activity for underlying diseases you may have. SEEK MEDICAL CARE IF:   Your condition is not improving after your transfusion.  You develop redness or irritation at the intravenous (IV) site. SEEK IMMEDIATE MEDICAL CARE IF:  Any of the following symptoms occur over the next 12  hours:  Shaking chills.  You have a temperature by mouth above 102 F (38.9 C), not controlled by medicine.  Chest, back, or muscle pain.  People around you feel you are not acting correctly or are confused.  Shortness of breath or difficulty breathing.  Dizziness and fainting.  You get a rash or develop hives.  You have a decrease in urine output.  Your urine turns a dark color or changes to pink, red, or brown. Any of the following symptoms occur over the next 10 days:  You have a temperature by mouth above 102 F (38.9 C), not controlled by medicine.  Shortness of breath.  Weakness after normal activity.  The white part of the eye turns yellow (jaundice).  You have a decrease in the amount of urine or are urinating less often.  Your urine turns a dark color or changes to pink, red, or brown. Document Released: 05/27/2000 Document Revised: 08/22/2011 Document Reviewed: 01/14/2008 [facility] Patient Information 2014 [place], [place].  _______________________________________________________________________

## 2015-01-29 ENCOUNTER — Encounter (HOSPITAL_COMMUNITY): Payer: Self-pay

## 2015-01-29 ENCOUNTER — Ambulatory Visit (HOSPITAL_COMMUNITY)
Admission: RE | Admit: 2015-01-29 | Discharge: 2015-01-29 | Disposition: A | Discharge status: Home / Self Care | Payer: Medicare Other | Source: Ambulatory Visit | Attending: Surgical | Admitting: Surgical

## 2015-01-29 ENCOUNTER — Encounter (HOSPITAL_COMMUNITY)
Admission: RE | Admit: 2015-01-29 | Discharge: 2015-01-29 | Disposition: A | Discharge status: Home / Self Care | Payer: Medicare Other | Source: Ambulatory Visit | Attending: Orthopedic Surgery | Admitting: Orthopedic Surgery

## 2015-01-29 DIAGNOSIS — Z01818 Encounter for other preprocedural examination: Secondary | ICD-10-CM | POA: Diagnosis not present

## 2015-01-29 DIAGNOSIS — Z01812 Encounter for preprocedural laboratory examination: Secondary | ICD-10-CM | POA: Diagnosis not present

## 2015-01-29 DIAGNOSIS — I517 Cardiomegaly: Secondary | ICD-10-CM | POA: Insufficient documentation

## 2015-01-29 DIAGNOSIS — J8410 Pulmonary fibrosis, unspecified: Secondary | ICD-10-CM | POA: Insufficient documentation

## 2015-01-29 DIAGNOSIS — M1612 Unilateral primary osteoarthritis, left hip: Secondary | ICD-10-CM | POA: Insufficient documentation

## 2015-01-29 DIAGNOSIS — I771 Stricture of artery: Secondary | ICD-10-CM | POA: Diagnosis not present

## 2015-01-29 HISTORY — DX: Unspecified malignant neoplasm of skin, unspecified: C44.90

## 2015-01-29 HISTORY — DX: Unspecified osteoarthritis, unspecified site: M19.90

## 2015-01-29 HISTORY — DX: Serous retinal detachment, unspecified eye: H33.20

## 2015-01-29 HISTORY — DX: Cyst of pancreas: K86.2

## 2015-01-29 HISTORY — DX: Personal history of other diseases of urinary system: Z87.448

## 2015-01-29 LAB — COMPREHENSIVE METABOLIC PANEL
ALT: 16 U/L — ABNORMAL LOW (ref 17–63)
AST: 22 U/L (ref 15–41)
Albumin: 4.5 g/dL (ref 3.5–5.0)
Alkaline Phosphatase: 104 U/L (ref 38–126)
Anion gap: 8 (ref 5–15)
BUN: 16 mg/dL (ref 6–20)
CO2: 28 mmol/L (ref 22–32)
Calcium: 9.8 mg/dL (ref 8.9–10.3)
Chloride: 106 mmol/L (ref 101–111)
Creatinine, Ser: 0.66 mg/dL (ref 0.61–1.24)
GFR calc Af Amer: >60 mL/min (ref >60)
GFR calc non Af Amer: >60 mL/min (ref >60)
Glucose, Bld: 108 mg/dL — ABNORMAL HIGH (ref 65–99)
Potassium: 4.3 mmol/L (ref 3.5–5.1)
Sodium: 142 mmol/L (ref 135–145)
Total Bilirubin: 0.7 mg/dL (ref 0.3–1.2)
Total Protein: 8 g/dL (ref 6.5–8.1)

## 2015-01-29 LAB — CBC WITH DIFFERENTIAL/PLATELET
Basophils Absolute: 0 10*3/uL (ref 0.0–0.1)
Basophils Relative: 0 % (ref 0–1)
Eosinophils Absolute: 0.1 10*3/uL (ref 0.0–0.7)
Eosinophils Relative: 1 % (ref 0–5)
HCT: 44.2 % (ref 39.0–52.0)
Hemoglobin: 14.4 g/dL (ref 13.0–17.0)
Lymphocytes Relative: 41 % (ref 12–46)
Lymphs Abs: 1.9 10*3/uL (ref 0.7–4.0)
MCH: 30.1 pg (ref 26.0–34.0)
MCHC: 32.6 g/dL (ref 30.0–36.0)
MCV: 92.3 fL (ref 78.0–100.0)
Monocytes Absolute: 0.5 10*3/uL (ref 0.1–1.0)
Monocytes Relative: 10 % (ref 3–12)
Neutro Abs: 2.2 10*3/uL (ref 1.7–7.7)
Neutrophils Relative %: 48 % (ref 43–77)
Platelets: 179 10*3/uL (ref 150–400)
RBC: 4.79 MIL/uL (ref 4.22–5.81)
RDW: 13.9 % (ref 11.5–15.5)
WBC: 4.6 10*3/uL (ref 4.0–10.5)

## 2015-01-29 LAB — URINE MICROSCOPIC-ADD ON

## 2015-01-29 LAB — URINALYSIS, ROUTINE W REFLEX MICROSCOPIC
Bilirubin Urine: NEGATIVE
Glucose, UA: NEGATIVE mg/dL
Ketones, ur: NEGATIVE mg/dL
Nitrite: NEGATIVE
Protein, ur: 30 mg/dL — AB
Specific Gravity, Urine: 1.02 (ref 1.005–1.030)
Urobilinogen, UA: 1 mg/dL (ref 0.0–1.0)
pH: 5.5 (ref 5.0–8.0)

## 2015-01-29 LAB — APTT: APTT: 33 s (ref 24–37)

## 2015-01-29 LAB — PROTIME-INR
INR: 1.46 (ref 0.00–1.49)
Prothrombin Time: 17.8 seconds — ABNORMAL HIGH (ref 11.6–15.2)

## 2015-01-29 NOTE — Progress Notes (Signed)
ABNORMAL UA FAXED TO [place]

## 2015-01-30 ENCOUNTER — Inpatient Hospital Stay (HOSPITAL_COMMUNITY): Admission: RE | Admit: 2015-01-30 | Payer: Medicare Other | Source: Ambulatory Visit
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
## 2015-02-10 ENCOUNTER — Inpatient Hospital Stay (HOSPITAL_COMMUNITY): Payer: Medicare Other

## 2015-02-10 ENCOUNTER — Encounter (HOSPITAL_COMMUNITY)
Admission: RE | Disposition: A | Discharge status: Home / Self Care | Payer: Self-pay | Source: Ambulatory Visit | Attending: Orthopedic Surgery

## 2015-02-10 ENCOUNTER — Inpatient Hospital Stay (HOSPITAL_COMMUNITY): Payer: Medicare Other | Admitting: Anesthesiology

## 2015-02-10 ENCOUNTER — Observation Stay (HOSPITAL_COMMUNITY)
Admission: RE | Admit: 2015-02-10 | Discharge: 2015-02-11 | Disposition: A | Discharge status: Home / Self Care | Payer: Medicare Other | Source: Ambulatory Visit | Attending: Orthopedic Surgery | Admitting: Orthopedic Surgery

## 2015-02-10 ENCOUNTER — Encounter (HOSPITAL_COMMUNITY): Payer: Self-pay | Admitting: *Deleted

## 2015-02-10 DIAGNOSIS — M1612 Unilateral primary osteoarthritis, left hip: Secondary | ICD-10-CM | POA: Diagnosis not present

## 2015-02-10 DIAGNOSIS — I4891 Unspecified atrial fibrillation: Secondary | ICD-10-CM | POA: Diagnosis not present

## 2015-02-10 DIAGNOSIS — M87059 Idiopathic aseptic necrosis of unspecified femur: Secondary | ICD-10-CM | POA: Diagnosis present

## 2015-02-10 DIAGNOSIS — Z23 Encounter for immunization: Secondary | ICD-10-CM | POA: Insufficient documentation

## 2015-02-10 DIAGNOSIS — M879 Osteonecrosis, unspecified: Secondary | ICD-10-CM | POA: Diagnosis not present

## 2015-02-10 DIAGNOSIS — I739 Peripheral vascular disease, unspecified: Secondary | ICD-10-CM | POA: Insufficient documentation

## 2015-02-10 DIAGNOSIS — H3530 Unspecified macular degeneration: Secondary | ICD-10-CM | POA: Diagnosis not present

## 2015-02-10 DIAGNOSIS — Z79891 Long term (current) use of opiate analgesic: Secondary | ICD-10-CM | POA: Diagnosis not present

## 2015-02-10 DIAGNOSIS — M353 Polymyalgia rheumatica: Secondary | ICD-10-CM | POA: Insufficient documentation

## 2015-02-10 DIAGNOSIS — I10 Essential (primary) hypertension: Secondary | ICD-10-CM | POA: Insufficient documentation

## 2015-02-10 DIAGNOSIS — T85698A Other mechanical complication of other specified internal prosthetic devices, implants and grafts, initial encounter: Secondary | ICD-10-CM | POA: Insufficient documentation

## 2015-02-10 DIAGNOSIS — Z79899 Other long term (current) drug therapy: Secondary | ICD-10-CM | POA: Insufficient documentation

## 2015-02-10 DIAGNOSIS — M25552 Pain in left hip: Secondary | ICD-10-CM | POA: Diagnosis present

## 2015-02-10 DIAGNOSIS — Z9889 Other specified postprocedural states: Secondary | ICD-10-CM

## 2015-02-10 DIAGNOSIS — Z7982 Long term (current) use of aspirin: Secondary | ICD-10-CM | POA: Insufficient documentation

## 2015-02-10 HISTORY — PX: HARDWARE REMOVAL: SHX979

## 2015-02-10 LAB — TYPE AND SCREEN
ABO/RH(D): A POS
ANTIBODY SCREEN: NEGATIVE

## 2015-02-10 LAB — CBC
HCT: 37 % — ABNORMAL LOW (ref 39.0–52.0)
Hemoglobin: 12.1 g/dL — ABNORMAL LOW (ref 13.0–17.0)
MCH: 32.3 pg (ref 26.0–34.0)
MCHC: 32.7 g/dL (ref 30.0–36.0)
MCV: 98.7 fL (ref 78.0–100.0)
PLATELETS: 178 10*3/uL (ref 150–400)
RBC: 3.75 MIL/uL — AB (ref 4.22–5.81)
RDW: 15.5 % (ref 11.5–15.5)
WBC: 8.8 10*3/uL (ref 4.0–10.5)

## 2015-02-10 LAB — CREATININE, SERUM
CREATININE: 1.05 mg/dL (ref 0.61–1.24)
GFR calc non Af Amer: 60 mL/min — ABNORMAL LOW (ref >60)

## 2015-02-10 SURGERY — REMOVAL, HARDWARE
Anesthesia: General | Laterality: Left

## 2015-02-10 MED ORDER — BISACODYL 5 MG PO TBEC: 5.0000 mg | DELAYED_RELEASE_TABLET | Freq: Every day | ORAL | Status: DC | PRN

## 2015-02-10 MED ORDER — BUPIVACAINE LIPOSOME 1.3 % IJ SUSP
20.0000 mL | Freq: Once | INTRAMUSCULAR | Status: AC
  Administered 2015-02-10: 20 mL
  Filled 2015-02-10: qty 20

## 2015-02-10 MED ORDER — FENTANYL CITRATE (PF) 100 MCG/2ML IJ SOLN
INTRAMUSCULAR | Status: DC | PRN
  Administered 2015-02-10 (×2): 25 ug via INTRAVENOUS

## 2015-02-10 MED ORDER — DEXAMETHASONE SODIUM PHOSPHATE 10 MG/ML IJ SOLN
INTRAMUSCULAR | Status: AC
Start: 2015-02-10 — End: 2015-02-10
  Filled 2015-02-10: qty 1

## 2015-02-10 MED ORDER — ONDANSETRON HCL 4 MG/2ML IJ SOLN
4.0000 mg | Freq: Four times a day (QID) | INTRAMUSCULAR | Status: DC | PRN
Start: 2015-02-10 — End: 2015-02-11

## 2015-02-10 MED ORDER — ONDANSETRON HCL 4 MG PO TABS: 4.0000 mg | ORAL_TABLET | Freq: Four times a day (QID) | ORAL | Status: DC | PRN

## 2015-02-10 MED ORDER — VITAMIN B-12 1000 MCG PO TABS
1000.0000 ug | ORAL_TABLET | Freq: Every morning | ORAL | Status: DC
  Administered 2015-02-11: 1000 ug via ORAL
  Filled 2015-02-10: qty 1

## 2015-02-10 MED ORDER — SODIUM CHLORIDE 0.9 % IR SOLN
Status: DC | PRN
  Administered 2015-02-10: 500 mL

## 2015-02-10 MED ORDER — ONDANSETRON HCL 4 MG/2ML IJ SOLN
INTRAMUSCULAR | Status: AC
  Filled 2015-02-10: qty 2

## 2015-02-10 MED ORDER — ACETAMINOPHEN 325 MG PO TABS: 650.0000 mg | ORAL_TABLET | Freq: Four times a day (QID) | ORAL | Status: DC | PRN

## 2015-02-10 MED ORDER — HYDROMORPHONE HCL 1 MG/ML IJ SOLN: 0.5000 mg | INTRAMUSCULAR | Status: DC | PRN

## 2015-02-10 MED ORDER — HYDROCODONE-ACETAMINOPHEN 5-325 MG PO TABS
1.0000 | ORAL_TABLET | Freq: Four times a day (QID) | ORAL | Status: DC | PRN
  Administered 2015-02-11: 1 via ORAL
  Filled 2015-02-10: qty 1

## 2015-02-10 MED ORDER — EPHEDRINE SULFATE 50 MG/ML IJ SOLN
INTRAMUSCULAR | Status: AC
  Filled 2015-02-10: qty 1

## 2015-02-10 MED ORDER — LACTATED RINGERS IV SOLN: INTRAVENOUS | Status: DC

## 2015-02-10 MED ORDER — DILTIAZEM HCL ER COATED BEADS 240 MG PO CP24
240.0000 mg | ORAL_CAPSULE | Freq: Every morning | ORAL | Status: DC
  Administered 2015-02-11: 240 mg via ORAL
  Filled 2015-02-10: qty 1

## 2015-02-10 MED ORDER — LIDOCAINE HCL (CARDIAC) 20 MG/ML IV SOLN
INTRAVENOUS | Status: AC
  Filled 2015-02-10: qty 5

## 2015-02-10 MED ORDER — SUCCINYLCHOLINE CHLORIDE 20 MG/ML IJ SOLN
INTRAMUSCULAR | Status: DC | PRN
  Administered 2015-02-10: 100 mg via INTRAVENOUS

## 2015-02-10 MED ORDER — PROPOFOL 10 MG/ML IV BOLUS
INTRAVENOUS | Status: DC | PRN
  Administered 2015-02-10: 70 mg via INTRAVENOUS

## 2015-02-10 MED ORDER — TAMSULOSIN HCL 0.4 MG PO CAPS
0.4000 mg | ORAL_CAPSULE | Freq: Every day | ORAL | Status: DC
  Administered 2015-02-10: 0.4 mg via ORAL
  Filled 2015-02-10 (×2): qty 1

## 2015-02-10 MED ORDER — ENOXAPARIN SODIUM 40 MG/0.4ML ~~LOC~~ SOLN
40.0000 mg | SUBCUTANEOUS | Status: DC
  Administered 2015-02-11: 40 mg via SUBCUTANEOUS
  Filled 2015-02-10 (×2): qty 0.4

## 2015-02-10 MED ORDER — LACTATED RINGERS IV SOLN
INTRAVENOUS | Status: DC
  Administered 2015-02-10 (×2): via INTRAVENOUS

## 2015-02-10 MED ORDER — ACETAMINOPHEN 650 MG RE SUPP: 650.0000 mg | Freq: Four times a day (QID) | RECTAL | Status: DC | PRN

## 2015-02-10 MED ORDER — SODIUM CHLORIDE 0.9 % IR SOLN
Status: AC
  Filled 2015-02-10: qty 1

## 2015-02-10 MED ORDER — OXYCODONE-ACETAMINOPHEN 5-325 MG PO TABS: 2.0000 | ORAL_TABLET | ORAL | Status: DC | PRN

## 2015-02-10 MED ORDER — ROCURONIUM BROMIDE 100 MG/10ML IV SOLN
INTRAVENOUS | Status: AC
  Filled 2015-02-10: qty 1

## 2015-02-10 MED ORDER — LIDOCAINE HCL 1 % IJ SOLN
INTRAMUSCULAR | Status: AC
  Filled 2015-02-10: qty 20

## 2015-02-10 MED ORDER — LIDOCAINE HCL (CARDIAC) 20 MG/ML IV SOLN
INTRAVENOUS | Status: DC | PRN
  Administered 2015-02-10: 100 mg via INTRAVENOUS

## 2015-02-10 MED ORDER — METOCLOPRAMIDE HCL 10 MG PO TABS
5.0000 mg | ORAL_TABLET | Freq: Three times a day (TID) | ORAL | Status: DC | PRN
Start: 2015-02-10 — End: 2015-02-11

## 2015-02-10 MED ORDER — PROPOFOL 10 MG/ML IV BOLUS
INTRAVENOUS | Status: AC
  Filled 2015-02-10: qty 20

## 2015-02-10 MED ORDER — HYDROMORPHONE HCL 1 MG/ML IJ SOLN: 0.2500 mg | INTRAMUSCULAR | Status: DC | PRN

## 2015-02-10 MED ORDER — BENAZEPRIL HCL 5 MG PO TABS
5.0000 mg | ORAL_TABLET | Freq: Every day | ORAL | Status: DC
  Administered 2015-02-10 – 2015-02-11 (×2): 5 mg via ORAL
  Filled 2015-02-10 (×2): qty 1

## 2015-02-10 MED ORDER — ONDANSETRON HCL 4 MG/2ML IJ SOLN: 4.0000 mg | Freq: Once | INTRAMUSCULAR | Status: DC | PRN

## 2015-02-10 MED ORDER — CHLORHEXIDINE GLUCONATE 4 % EX LIQD: 60.0000 mL | Freq: Once | CUTANEOUS | Status: DC

## 2015-02-10 MED ORDER — POLYETHYLENE GLYCOL 3350 17 G PO PACK: 17.0000 g | PACK | Freq: Every day | ORAL | Status: DC | PRN

## 2015-02-10 MED ORDER — SODIUM CHLORIDE (HYPERTONIC) 5 % OP SOLN
1.0000 [drp] | Freq: Two times a day (BID) | OPHTHALMIC | Status: DC
  Administered 2015-02-11: 1 [drp] via OPHTHALMIC
  Filled 2015-02-10: qty 15

## 2015-02-10 MED ORDER — EPHEDRINE SULFATE 50 MG/ML IJ SOLN
INTRAMUSCULAR | Status: DC | PRN
  Administered 2015-02-10: 10 mg via INTRAVENOUS
  Administered 2015-02-10 (×3): 5 mg via INTRAVENOUS
  Administered 2015-02-10: 15 mg via INTRAVENOUS

## 2015-02-10 MED ORDER — INFLUENZA VAC SPLIT QUAD 0.5 ML IM SUSY
0.5000 mL | PREFILLED_SYRINGE | INTRAMUSCULAR | Status: AC
  Administered 2015-02-11: 0.5 mL via INTRAMUSCULAR
  Filled 2015-02-10 (×2): qty 0.5

## 2015-02-10 MED ORDER — METHOCARBAMOL 500 MG PO TABS: 500.0000 mg | ORAL_TABLET | Freq: Four times a day (QID) | ORAL | Status: DC | PRN

## 2015-02-10 MED ORDER — ONDANSETRON HCL 4 MG/2ML IJ SOLN
INTRAMUSCULAR | Status: DC | PRN
  Administered 2015-02-10: 4 mg via INTRAVENOUS

## 2015-02-10 MED ORDER — VANCOMYCIN HCL IN DEXTROSE 1-5 GM/200ML-% IV SOLN
1000.0000 mg | INTRAVENOUS | Status: DC
  Administered 2015-02-10: 1000 mg via INTRAVENOUS
  Filled 2015-02-10: qty 200

## 2015-02-10 MED ORDER — VANCOMYCIN HCL IN DEXTROSE 1-5 GM/200ML-% IV SOLN
1000.0000 mg | Freq: Two times a day (BID) | INTRAVENOUS | Status: AC
  Administered 2015-02-10: 1000 mg via INTRAVENOUS
  Filled 2015-02-10: qty 200

## 2015-02-10 MED ORDER — FENTANYL CITRATE (PF) 100 MCG/2ML IJ SOLN
INTRAMUSCULAR | Status: AC
  Filled 2015-02-10: qty 4

## 2015-02-10 MED ORDER — DEXAMETHASONE SODIUM PHOSPHATE 10 MG/ML IJ SOLN
INTRAMUSCULAR | Status: DC | PRN
  Administered 2015-02-10: 10 mg via INTRAVENOUS

## 2015-02-10 MED ORDER — METHOCARBAMOL 1000 MG/10ML IJ SOLN
500.0000 mg | Freq: Four times a day (QID) | INTRAVENOUS | Status: DC | PRN
  Filled 2015-02-10: qty 5

## 2015-02-10 MED ORDER — METOCLOPRAMIDE HCL 5 MG/ML IJ SOLN: 5.0000 mg | Freq: Three times a day (TID) | INTRAMUSCULAR | Status: DC | PRN

## 2015-02-10 SURGICAL SUPPLY — 26 items
DRSG AQUACEL AG ADV 3.5X 6 (GAUZE/BANDAGES/DRESSINGS) ×2 IMPLANT
DURAPREP 26ML APPLICATOR (WOUND CARE) ×2 IMPLANT
ELECT REM PT RETURN 9FT ADLT (ELECTROSURGICAL) ×2
ELECTRODE REM PT RTRN 9FT ADLT (ELECTROSURGICAL) ×1 IMPLANT
GLOVE BIOGEL PI IND STRL 8 (GLOVE) ×1 IMPLANT
GLOVE BIOGEL PI INDICATOR 8 (GLOVE) ×1
GLOVE ECLIPSE 8.0 STRL XLNG CF (GLOVE) ×4 IMPLANT
GOWN STRL REUS W/TWL LRG LVL3 (GOWN DISPOSABLE) ×6 IMPLANT
GOWN STRL REUS W/TWL XL LVL3 (GOWN DISPOSABLE) ×4 IMPLANT
IMMOBILIZER KNEE 20 (SOFTGOODS) ×2
IMMOBILIZER KNEE 20 THIGH 36 (SOFTGOODS) IMPLANT
KIT BASIN OR (CUSTOM PROCEDURE TRAY) ×3 IMPLANT
LIQUID BAND (GAUZE/BANDAGES/DRESSINGS) ×1 IMPLANT
MANIFOLD NEPTUNE II (INSTRUMENTS) ×2 IMPLANT
NS IRRIG 1000ML POUR BTL (IV SOLUTION) ×2 IMPLANT
PACK TOTAL JOINT (CUSTOM PROCEDURE TRAY) ×1 IMPLANT
POSITIONER SURGICAL ARM (MISCELLANEOUS) ×4 IMPLANT
STOCKINETTE 8 INCH (MISCELLANEOUS) ×2 IMPLANT
SUT MNCRL AB 4-0 PS2 18 (SUTURE) ×2 IMPLANT
SUT VIC AB 0 CT1 27 (SUTURE) ×2
SUT VIC AB 0 CT1 27XBRD ANTBC (SUTURE) ×1 IMPLANT
SUT VIC AB 1 CT1 27 (SUTURE) ×4
SUT VIC AB 1 CT1 27XBRD ANTBC (SUTURE) IMPLANT
SUT VIC AB 2-0 CT1 27 (SUTURE) ×4
SUT VIC AB 2-0 CT1 TAPERPNT 27 (SUTURE) IMPLANT
TOWEL OR 17X26 10 PK STRL BLUE (TOWEL DISPOSABLE) ×4 IMPLANT

## 2015-02-10 NOTE — Interval H&P Note (Signed)
History and Physical Interval Note:  02/10/2015 12:15 PM  [name] [name]  has presented today for surgery, with the diagnosis of OA LEFT HIP  The various methods of treatment have been discussed with the patient and family. After consideration of risks, benefits and other options for treatment, the patient has consented to  Procedure(s): REMOVAL TROCH NAIL AND DISTAL SCREWS LEFT HIP/FEMUR (Left) as a surgical intervention .  The patient's history has been reviewed, patient examined, no change in status, stable for surgery.  I have reviewed the patient's chart and labs.  Questions were answered to the patient's satisfaction.     [name]

## 2015-02-10 NOTE — Anesthesia Preprocedure Evaluation (Addendum)
Anesthesia Evaluation  Patient identified by MRN, date of birth, ID band Patient awake    Reviewed: Allergy & Precautions, NPO status , Patient's Chart, lab work & pertinent test results  Airway Mallampati: II  TM Distance: >3 FB Neck ROM: Full    Dental  (+) Dental Advisory Given   Pulmonary neg pulmonary ROS,  breath sounds clear to auscultation        Cardiovascular hypertension, Pt. on medications + Peripheral Vascular Disease + dysrhythmias Atrial Fibrillation Rhythm:Regular Rate:Normal     Neuro/Psych negative neurological ROS     GI/Hepatic negative GI ROS, Neg liver ROS,   Endo/Other  negative endocrine ROS  Renal/GU negative Renal ROS     Musculoskeletal  (+) Arthritis -,   Abdominal   Peds  Hematology negative hematology ROS (+)   Anesthesia Other Findings   Reproductive/Obstetrics                            Anesthesia Physical Anesthesia Plan  ASA: III  Anesthesia Plan: General   Post-op Pain Management:    Induction: Intravenous  Airway Management Planned: Oral ETT  Additional Equipment:   Intra-op Plan:   Post-operative Plan: Extubation in OR  Informed Consent: I have reviewed the patients History and Physical, chart, labs and discussed the procedure including the risks, benefits and alternatives for the proposed anesthesia with the patient or authorized representative who has indicated his/her understanding and acceptance.   Dental advisory given  Plan Discussed with: CRNA  Anesthesia Plan Comments:         Anesthesia Quick Evaluation

## 2015-02-10 NOTE — Brief Op Note (Signed)
02/10/2015  1:23 PM  PATIENT:  [name] [name]  81 y.o. male  PRE-OPERATIVE DIAGNOSIS:  Avascular Necrosis of Left Hip and Painful,Protruding Left Compression Screw.  POST-OPERATIVE DIAGNOSIS:Exostosis of Left Greater Trochanter and Painful,protruding Compression Hip Screw. PROCEDURE:  Removal of a Protruding Hip Compression Screw on the left and Removal of Trochanteric Exostosis. SURGEON:  Surgeon(s) and Role:    * Latanya Maudlin, MD - Primary  PHYSICIAN ASSISTANT: Ardeen Jourdain PA  ASSISTANTS:Amber Millington PA   ANESTHESIA:   general  EBL:  Total I/O In: 1000 [I.V.:1000] Out: -   BLOOD ADMINISTERED:none  DRAINS: none   LOCAL MEDICATIONS USED:   20 cc of Exparel  SPECIMEN:  No Specimen  DISPOSITION OF SPECIMEN:  N/A  COUNTS:  YES  TOURNIQUET:    DICTATION: .Other Dictation: Dictation Number [id number]  PLAN OF CARE: Admit for overnight observation  PATIENT DISPOSITION:  Stable in OR   Delay start of Pharmacological VTE agent (>24hrs) due to surgical blood loss or risk of bleeding: yes

## 2015-02-10 NOTE — Transfer of Care (Signed)
Immediate Anesthesia Transfer of Care Note  Patient: [name] [name]  Procedure(s) Performed: Procedure(s): REMOVAL  DISTAL SCREW LEFT HIP/FEMUR (Left)  Patient Location: PACU  Anesthesia Type:General  Level of Consciousness: sedated  Airway & Oxygen Therapy: Patient Spontanous Breathing and Patient connected to face mask oxygen  Post-op Assessment: Report given to RN and Post -op Vital signs reviewed and stable  Post vital signs: Reviewed and stable  Last Vitals:  Filed Vitals:   02/10/15 1036  BP: 146/73  Pulse: 72  Temp: 36.5 C  Resp: 16    Complications: No apparent anesthesia complications

## 2015-02-10 NOTE — Anesthesia Postprocedure Evaluation (Signed)
  Anesthesia Post-op Note  Patient: Clemetine Marker Kistner  Procedure(s) Performed: Procedure(s): REMOVAL  DISTAL SCREW LEFT HIP/FEMUR (Left)  Patient Location: PACU  Anesthesia Type:General  Level of Consciousness: awake and alert   Airway and Oxygen Therapy: Patient Spontanous Breathing  Post-op Pain: none  Post-op Assessment: Post-op Vital signs reviewed LLE Motor Response: Purposeful movement LLE Sensation: Full sensation RLE Motor Response: Purposeful movement RLE Sensation: Full sensation      Post-op Vital Signs: Reviewed  Last Vitals:  Filed Vitals:   02/10/15 1615  BP: 139/76  Pulse: 64  Temp: 36.5 C  Resp: 15    Complications: No apparent anesthesia complications

## 2015-02-11 ENCOUNTER — Encounter (HOSPITAL_COMMUNITY): Payer: Self-pay | Admitting: Orthopedic Surgery

## 2015-02-11 DIAGNOSIS — Z9889 Other specified postprocedural states: Secondary | ICD-10-CM

## 2015-02-11 DIAGNOSIS — M879 Osteonecrosis, unspecified: Secondary | ICD-10-CM | POA: Diagnosis not present

## 2015-02-11 LAB — BASIC METABOLIC PANEL
Anion gap: 6 (ref 5–15)
BUN: 21 mg/dL — AB (ref 6–20)
CALCIUM: 8.3 mg/dL — AB (ref 8.9–10.3)
CO2: 23 mmol/L (ref 22–32)
CREATININE: 1.2 mg/dL (ref 0.61–1.24)
Chloride: 108 mmol/L (ref 101–111)
GFR calc Af Amer: 59 mL/min — ABNORMAL LOW (ref >60)
GFR, EST NON AFRICAN AMERICAN: 51 mL/min — AB (ref >60)
GLUCOSE: 215 mg/dL — AB (ref 65–99)
POTASSIUM: 4.6 mmol/L (ref 3.5–5.1)
SODIUM: 137 mmol/L (ref 135–145)

## 2015-02-11 MED ORDER — TIZANIDINE HCL 4 MG PO TABS: 4.0000 mg | ORAL_TABLET | Freq: Four times a day (QID) | ORAL | Status: AC | PRN

## 2015-02-11 MED ORDER — HYDROCODONE-ACETAMINOPHEN 5-325 MG PO TABS: 1.0000 | ORAL_TABLET | Freq: Four times a day (QID) | ORAL | Status: AC | PRN

## 2015-02-11 MED ORDER — ASPIRIN EC 325 MG PO TBEC: 325.0000 mg | DELAYED_RELEASE_TABLET | Freq: Two times a day (BID) | ORAL | Status: DC

## 2015-02-11 NOTE — Evaluation (Signed)
Physical Therapy Evaluation Patient Details Name: [name] [name] MRN: [id number] DOB: [birth year] Today's Date: 02/11/2015   History of Present Illness  81 yo male s/p removal hip screw and trochanteric exostosis. Hx of L hip ORIF, polymyalgia rheumatica, macular degeneration, spinal stenosis.  Clinical Impression  On eval, pt required Min-Mod assist for mobility-walked ~15 feet x 2 with RW. Pt tolerated activity fairly well. Family/caregiver present-plan is for home with 24 hour care. Recommend HHPT.     Follow Up Recommendations Home health PT;Supervision/Assistance - 24 hour    Equipment Recommendations  None recommended by PT    Recommendations for Other Services OT consult     Precautions / Restrictions Precautions Precautions: Fall Required Braces or Orthoses: Knee Immobilizer - Left Knee Immobilizer - Left:  (KI in place-checked with RN who stated its most likely on for comfort-did not remove during session) Restrictions Weight Bearing Restrictions: No      Mobility  Bed Mobility Overal bed mobility: Needs Assistance Bed Mobility: Supine to Sit     Supine to sit: HOB elevated;Min assist     General bed mobility comments: Increased time. Assist for L LE. Utilized bedpad to aid with scooting  Transfers Overall transfer level: Needs assistance Equipment used: Rolling walker (2 wheeled) Transfers: Sit to/from Stand Sit to Stand: From elevated surface;Mod assist         General transfer comment: assist to rise, stabilize, control descent. VCs safety, technique, hand placement  Ambulation/Gait Ambulation/Gait assistance: Min assist Ambulation Distance (Feet): 15 Feet (x2) Assistive device: Rolling walker (2 wheeled) Gait Pattern/deviations: Step-to pattern;Trunk flexed     General Gait Details: VCs safety, technique, adherence to PWB status, Assist to stabilize pt and maneuver safely with RW. Pt fatigues easily.   Stairs            Wheelchair  Mobility    Modified Rankin (Stroke Patients Only)       Balance Overall balance assessment: Needs assistance         Standing balance support: Bilateral upper extremity supported;During functional activity Standing balance-Leahy Scale: Poor                               Pertinent Vitals/Pain Pain Assessment: Faces Faces Pain Scale: Hurts even more Pain Location: L LE with activity Pain Descriptors / Indicators: Sore;Aching Pain Intervention(s): Monitored during session;Limited activity within patient's tolerance;Repositioned    Home Living Family/patient expects to be discharged to:: Private residence Living Arrangements: Children;Non-relatives/Friends Available Help at Discharge: Available 24 hours/day;Personal care attendant;Family Type of Home: House Home Access: Level entry     Home Layout: One level Home Equipment: Bedside commode;Shower seat;Walker - 2 wheels;Walker - 4 wheels;Wheelchair - manual;Grab bars - tub/shower;Hospital bed Additional Comments: has all equipment from wife; grab bars and hand held shower    Prior Function Level of Independence: Needs assistance               Hand Dominance        Extremity/Trunk Assessment   Upper Extremity Assessment: Defer to OT evaluation           Lower Extremity Assessment: Generalized weakness;LLE deficits/detail   LLE Deficits / Details: moves ankle well. hip flex at least 2/5. Did not remove KI for MMT.   Cervical / Trunk Assessment: Kyphotic  Communication   Communication: HOH  Cognition Arousal/Alertness: Awake/alert Behavior During Therapy: WFL for tasks assessed/performed Overall Cognitive Status: Within Functional Limits for  tasks assessed                      General Comments      Exercises        Assessment/Plan    PT Assessment Patient needs continued PT services  PT Diagnosis Difficulty walking;Acute pain;Generalized weakness   PT Problem List  Decreased strength;Decreased range of motion;Decreased activity tolerance;Decreased balance;Decreased mobility;Decreased knowledge of use of DME;Pain;Decreased knowledge of precautions  PT Treatment Interventions DME instruction;Gait training;Functional mobility training;Therapeutic activities;Patient/family education;Balance training;Therapeutic exercise   PT Goals (Current goals can be found in the Care Plan section) Acute Rehab PT Goals Patient Stated Goal: less pain. per family, home PT Goal Formulation: With patient/family Time For Goal Achievement: 02/18/15 Potential to Achieve Goals: Good    Frequency Min 3X/week   Barriers to discharge        Co-evaluation               End of Session Equipment Utilized During Treatment: Gait belt;Left knee immobilizer Activity Tolerance: Patient tolerated treatment well Patient left: in chair;with call bell/phone within reach;with family/visitor present      Functional Assessment Tool Used: clinical judgement Functional Limitation: Mobility: Walking and moving around Mobility: Walking and Moving Around Current Status (J4492): At least 20 percent but less than 40 percent impaired, limited or restricted Mobility: Walking and Moving Around Goal Status [phone]): At least 1 percent but less than 20 percent impaired, limited or restricted    Time: 0953-1014 PT Time Calculation (min) (ACUTE ONLY): 21 min   Charges:   PT Evaluation $Initial PT Evaluation Tier I: 1 Procedure     PT G Codes:   PT G-Codes **NOT FOR INPATIENT CLASS** Functional Assessment Tool Used: clinical judgement Functional Limitation: Mobility: Walking and moving around Mobility: Walking and Moving Around Current Status (H2197): At least 20 percent but less than 40 percent impaired, limited or restricted Mobility: Walking and Moving Around Goal Status [phone]): At least 1 percent but less than 20 percent impaired, limited or restricted    Weston Anna, MPT Pager:  [phone]

## 2015-02-11 NOTE — Care Management Note (Signed)
Case Management Note  Patient Details  Name: JAMIS KRYDER MRN: [id number] Date of Birth: 1922/03/29  Subjective/Objective:                  REMOVAL DISTAL SCREW LEFT HIP/FEMUR (Left)  Action/Plan:  Discharge planning Expected Discharge Date:  02/11/15               Expected Discharge Plan:  [place]  In-House Referral:     Discharge planning Services  CM Consult  Post Acute Care Choice:  Home Health Choice offered to:  Patient  DME Arranged:  N/A DME Agency:  NA  HH Arranged:  [place] Agency:  [place]  Status of Service:  Completed, signed off  Medicare Important Message Given:    Date Medicare IM Given:    Medicare IM give by:    Date Additional Medicare IM Given:    Additional Medicare Important Message give by:     If discussed at [place] of Stay Meetings, dates discussed:    Additional Comments: CM met with pt and pt's daughter in room to offer choice of home health agency.  Pt chooses [facility] of [facility] to render HHPT.  Pt has all DME at home.  Address and contact information verified by pt.  Referral called to [facility] rep, Kristen with request for Elsie Lincoln as PT. No other CM needs were communicated. Dellie Catholic, RN 02/11/2015, 9:57 AM

## 2015-02-11 NOTE — Op Note (Signed)
[name], [name]             ACCOUNT NO.:  [id number]  MEDICAL RECORD NO.:  [id number]  LOCATION:  7619                         FACILITY:  [facility]  PHYSICIAN:  Kipp Brood. Levell Tavano, M.D.DATE OF BIRTH:  1921-06-27  DATE OF PROCEDURE:  02/10/2015 DATE OF DISCHARGE:                              OPERATIVE REPORT   SURGEON:  Kipp Brood. Gladstone Lighter, M.D.  ASSISTANT:  Ardeen Jourdain, [place].  PREOPERATIVE DIAGNOSES: 1. Avascular necrosis, left femoral head. 2. A protruding compression screw, hip nail, left hip. 3. Previous intertrochanteric fracture left hip with a trochanter nail     and compression screw.  POSTOPERATIVE DIAGNOSES: 1. Avascular necrosis, left femoral head. 2. A protruding compression screw, hip nail, left hip. 3. Previous intertrochanteric fracture left hip with a trochanter nail     and compression screw. Note, this surgery was done by Dr. Alvan Dame about 8 years ago.  The plan here in this case was he is 85, he is not really interested and I am not interested in doing a total hip replacement on him at this age and at this activity level, so we elected just to go ahead and take the hip compression screw out since that is really what was protruding through the head into the acetabulum.  I elected not to remove the trochanteric nail itself for fear that we could create a stress riser once that is removed and transverse screw.  The reason for that is he is osteoporotic and he is 89.  So the operation was removal of hip compression screw, left hip.  DESCRIPTION OF PROCEDURE:  The appropriate time-out was first carried out in the operating room after I marked the appropriate left hip in the holding area.  After this, a sterile prep and draping of left hip was carried out and the appropriate time-out was carried out.  The patient was on the right side, left side up.  He had 1 g IV vancomycin.  An incision was made over what I thought initially was a hip screw, what it was, was a  large exostosis from the greater trochanter.  I first removed the large exostosis from the trochanter, next went down distally, identified the compression screw, it was severely locked in.  I utilized a small osteotome to remove all the bone from around the area.  I then went down, had to use a plier to remove this because it was an O-type troch nail.  We thoroughly irrigated out the area.  We bone waxed the raw into the greater trochanter and then closed the wound in layers in usual fashion.  He was given 20 mL of Exparel into the wound site.  He will be kept overnight.          ______________________________ Kipp Brood. Gladstone Lighter, M.D.     RAG/MEDQ  D:  02/10/2015  T:  02/11/2015  Job:  [id number]

## 2015-02-11 NOTE — Discharge Instructions (Signed)
Do not remove dressing unless it appears compromised Dressing is waterproof. You may shower but do not submerge the right hip in water Take aspirin 325mg  twice daily starting Thursday 02/12/2015 for two weeks Partial weightbearing 50% left leg. Walk with a walker Call if any temperatures greater than 101 or any wound complications: [phone] during the day and ask for Dr. Charlestine Night nurse, Brunilda Payor.

## 2015-02-11 NOTE — Progress Notes (Signed)
   Subjective: 1 Day Post-Op Procedure(s) (LRB): REMOVAL  DISTAL SCREW LEFT HIP/FEMUR (Left) Patient reports pain as mild.   Patient seen in rounds with Dr. Gladstone Lighter. Patient is well, and has had no acute complaints or problems. Voiding well. No issues overnight. No SOB or chest pain. Pain under good control. Daughter in room during rounds.    Objective: Vital signs in last 24 hours: Temp:  [97.5 F (36.4 C)-98.7 F (37.1 C)] 98.7 F (37.1 C) (08/31 0524) Pulse Rate:  [50-81] 68 (08/31 0524) Resp:  [15-19] 18 (08/31 0524) BP: (118-146)/(56-78) 118/67 mmHg (08/31 0524) SpO2:  [99 %-100 %] 99 % (08/31 0524) Weight:  [67.132 kg (148 lb)] 67.132 kg (148 lb) (08/30 1036)  Intake/Output from previous day:  Intake/Output Summary (Last 24 hours) at 02/11/15 0931 Last data filed at 02/11/15 0900  Gross per 24 hour  Intake 4207.83 ml  Output    325 ml  Net 3882.83 ml    Intake/Output this shift: Total I/O In: 240 [P.O.:240] Out: -   Labs:  Recent Labs  02/10/15 1801  HGB 12.1*    Recent Labs  02/10/15 1801  WBC 8.8  RBC 3.75*  HCT 37.0*  PLT 178    Recent Labs  02/10/15 1801 02/11/15 0432  NA  --  137  K  --  4.6  CL  --  108  CO2  --  23  BUN  --  21*  CREATININE 1.05 1.20  GLUCOSE  --  215*  CALCIUM  --  8.3*    EXAM General - Patient is Alert and Oriented Extremity - Neurologically intact Intact pulses distally Dorsiflexion/Plantar flexion intact No cellulitis present Compartment soft Dressing/Incision - clean, dry, no drainage Motor Function - intact, moving foot and toes well on exam.   Past Medical History  Diagnosis Date  . Occlusion and stenosis of carotid artery without mention of cerebral infarction   . Unspecified essential hypertension   . Elevated prostate specific antigen (PSA)   . Polymyalgia rheumatica   . Macular degeneration     PT STILL ABLE TO SEE TO READ  . Lower extremity weakness     2 PREVIOUS HIP FRACTURES  .  Peripheral edema     NO RECENT PROBLEMS  . Lumbar stenosis     SEVERE BACK  AND LEFT LEG PAIN WITH LEFT FOOT DROP  . Carcinomas, basal cell     NOSE  . Dysrhythmia     AF- DR. Liane Comber IS PT'S CARDIOLOGIST - BLOOD THINNER WAS D/C IN AUGUST 2015 BY HIS CARDIOLGIST BECAUSE HX OF FALLS  . Arthritis   . Skin cancer     REMOVED FROM NOSE  . H/O urethral stricture   . Detached retina     RT EYE  . Pancreatic cyst     NO PROBLEMS OR TREATMENT    Assessment/Plan: 1 Day Post-Op Procedure(s) (LRB): REMOVAL  DISTAL SCREW LEFT HIP/FEMUR (Left) Active Problems:   Avascular necrosis of hip   S/P hardware removal  Estimated body mass index is 21.85 kg/(m^2) as calculated from the following:   Height as of this encounter: 5\' 9"  (1.753 m).   Weight as of this encounter: 67.132 kg (148 lb). Advance diet Up with therapy D/C IV fluids Discharge home with home health  DVT Prophylaxis - Lovenox Partial weightbearing 50% left LE DC home today after PT  Ardeen Jourdain, PA-C Orthopaedic Surgery 02/11/2015, 9:31 AM

## 2015-02-13 NOTE — Discharge Summary (Signed)
Physician Discharge Summary   Patient ID: [name] [name] MRN: [id number] DOB/AGE: 79-Oct-1923 79 y.o.  Admit date: 02/10/2015 Discharge date: 02/11/2015  Primary Diagnosis: Avascular necrosis left hip History of left hip ORIF    Admission Diagnoses:  Past Medical History  Diagnosis Date  . Occlusion and stenosis of carotid artery without mention of cerebral infarction   . Unspecified essential hypertension   . Elevated prostate specific antigen (PSA)   . Polymyalgia rheumatica   . Macular degeneration     PT STILL ABLE TO SEE TO READ  . Lower extremity weakness     2 PREVIOUS HIP FRACTURES  . Peripheral edema     NO RECENT PROBLEMS  . Lumbar stenosis     SEVERE BACK  AND LEFT LEG PAIN WITH LEFT FOOT DROP  . Carcinomas, basal cell     NOSE  . Dysrhythmia     AF- DR. Liane Comber IS PT'S CARDIOLOGIST - BLOOD THINNER WAS D/C IN AUGUST 2015 BY HIS CARDIOLGIST BECAUSE HX OF FALLS  . Arthritis   . Skin cancer     REMOVED FROM NOSE  . H/O urethral stricture   . Detached retina     RT EYE  . Pancreatic cyst     NO PROBLEMS OR TREATMENT   Discharge Diagnoses:   Active Problems:   Avascular necrosis of hip   S/P hardware removal  Estimated body mass index is 21.85 kg/(m^2) as calculated from the following:   Height as of this encounter: '5\' 9"'  (1.753 m).   Weight as of this encounter: 67.132 kg (148 lb).  Procedure:  Procedure(s) (LRB): REMOVAL  DISTAL SCREW LEFT HIP/FEMUR (Left)   Consults: None  HPI: The patient presented with the chief complaint of left hip pain. He has a history of a left hip fracture ORIF with nailing. He has started having movement of the components in addition to advancement of osteoarthritic changes causing pain with weightbearing.   Laboratory Data: Admission on 02/10/2015, Discharged on 02/11/2015  Component Date Value Ref Range Status  . ABO/RH(D) 02/10/2015 A POS   Final  . Antibody Screen 02/10/2015 NEG   Final  . Sample Expiration  02/10/2015 02/13/2015   Final  . WBC 02/10/2015 8.8  4.0 - 10.5 K/uL Final  . RBC 02/10/2015 3.75* 4.22 - 5.81 MIL/uL Final  . Hemoglobin 02/10/2015 12.1* 13.0 - 17.0 g/dL Final  . HCT 02/10/2015 37.0* 39.0 - 52.0 % Final  . MCV 02/10/2015 98.7  78.0 - 100.0 fL Final  . MCH 02/10/2015 32.3  26.0 - 34.0 pg Final  . MCHC 02/10/2015 32.7  30.0 - 36.0 g/dL Final  . RDW 02/10/2015 15.5  11.5 - 15.5 % Final  . Platelets 02/10/2015 178  150 - 400 K/uL Final  . Creatinine, Ser 02/10/2015 1.05  0.61 - 1.24 mg/dL Final  . GFR calc non Af Amer 02/10/2015 60* >60 mL/min Final  . GFR calc Af Amer 02/10/2015 >60  >60 mL/min Final   Comment: (NOTE) The eGFR has been calculated using the CKD EPI equation. This calculation has not been validated in all clinical situations. eGFR's persistently <60 mL/min signify possible Chronic Kidney Disease.   . Sodium 02/11/2015 137  135 - 145 mmol/L Final  . Potassium 02/11/2015 4.6  3.5 - 5.1 mmol/L Final  . Chloride 02/11/2015 108  101 - 111 mmol/L Final  . CO2 02/11/2015 23  22 - 32 mmol/L Final  . Glucose, Bld 02/11/2015 215* 65 - 99 mg/dL Final  .  BUN 02/11/2015 21* 6 - 20 mg/dL Final  . Creatinine, Ser 02/11/2015 1.20  0.61 - 1.24 mg/dL Final  . Calcium 02/11/2015 8.3* 8.9 - 10.3 mg/dL Final  . GFR calc non Af Amer 02/11/2015 51* >60 mL/min Final  . GFR calc Af Amer 02/11/2015 59* >60 mL/min Final   Comment: (NOTE) The eGFR has been calculated using the CKD EPI equation. This calculation has not been validated in all clinical situations. eGFR's persistently <60 mL/min signify possible Chronic Kidney Disease.   Georgiann Hahn gap 02/11/2015 6  5 - 15 Final  Hospital Outpatient Visit on 01/29/2015  Component Date Value Ref Range Status  . aPTT 01/29/2015 33  24 - 37 seconds Final  . WBC 01/29/2015 4.6  4.0 - 10.5 K/uL Final  . RBC 01/29/2015 4.79  4.22 - 5.81 MIL/uL Final  . Hemoglobin 01/29/2015 14.4  13.0 - 17.0 g/dL Final  . HCT 01/29/2015 44.2  39.0 -  52.0 % Final  . MCV 01/29/2015 92.3  78.0 - 100.0 fL Final  . MCH 01/29/2015 30.1  26.0 - 34.0 pg Final  . MCHC 01/29/2015 32.6  30.0 - 36.0 g/dL Final  . RDW 01/29/2015 13.9  11.5 - 15.5 % Final  . Platelets 01/29/2015 179  150 - 400 K/uL Final  . Neutrophils Relative % 01/29/2015 48  43 - 77 % Final  . Neutro Abs 01/29/2015 2.2  1.7 - 7.7 K/uL Final  . Lymphocytes Relative 01/29/2015 41  12 - 46 % Final  . Lymphs Abs 01/29/2015 1.9  0.7 - 4.0 K/uL Final  . Monocytes Relative 01/29/2015 10  3 - 12 % Final  . Monocytes Absolute 01/29/2015 0.5  0.1 - 1.0 K/uL Final  . Eosinophils Relative 01/29/2015 1  0 - 5 % Final  . Eosinophils Absolute 01/29/2015 0.1  0.0 - 0.7 K/uL Final  . Basophils Relative 01/29/2015 0  0 - 1 % Final  . Basophils Absolute 01/29/2015 0.0  0.0 - 0.1 K/uL Final  . Sodium 01/29/2015 142  135 - 145 mmol/L Final  . Potassium 01/29/2015 4.3  3.5 - 5.1 mmol/L Final  . Chloride 01/29/2015 106  101 - 111 mmol/L Final  . CO2 01/29/2015 28  22 - 32 mmol/L Final  . Glucose, Bld 01/29/2015 108* 65 - 99 mg/dL Final  . BUN 01/29/2015 16  6 - 20 mg/dL Final  . Creatinine, Ser 01/29/2015 0.66  0.61 - 1.24 mg/dL Final  . Calcium 01/29/2015 9.8  8.9 - 10.3 mg/dL Final  . Total Protein 01/29/2015 8.0  6.5 - 8.1 g/dL Final  . Albumin 01/29/2015 4.5  3.5 - 5.0 g/dL Final  . AST 01/29/2015 22  15 - 41 U/L Final  . ALT 01/29/2015 16* 17 - 63 U/L Final  . Alkaline Phosphatase 01/29/2015 104  38 - 126 U/L Final  . Total Bilirubin 01/29/2015 0.7  0.3 - 1.2 mg/dL Final  . GFR calc non Af Amer 01/29/2015 >60  >60 mL/min Final  . GFR calc Af Amer 01/29/2015 >60  >60 mL/min Final   Comment: (NOTE) The eGFR has been calculated using the CKD EPI equation. This calculation has not been validated in all clinical situations. eGFR's persistently <60 mL/min signify possible Chronic Kidney Disease.   . Anion gap 01/29/2015 8  5 - 15 Final  . Prothrombin Time 01/29/2015 17.8* 11.6 - 15.2  seconds Final  . INR 01/29/2015 1.46  0.00 - 1.49 Final  . Color, Urine 01/29/2015 Arwyn Besaw* YELLOW Final  BIOCHEMICALS MAY BE AFFECTED BY COLOR  . APPearance 01/29/2015 TURBID* CLEAR Final  . Specific Gravity, Urine 01/29/2015 1.020  1.005 - 1.030 Final  . pH 01/29/2015 5.5  5.0 - 8.0 Final  . Glucose, UA 01/29/2015 NEGATIVE  NEGATIVE mg/dL Final  . Hgb urine dipstick 01/29/2015 TRACE* NEGATIVE Final  . Bilirubin Urine 01/29/2015 NEGATIVE  NEGATIVE Final  . Ketones, ur 01/29/2015 NEGATIVE  NEGATIVE mg/dL Final  . Protein, ur 01/29/2015 30* NEGATIVE mg/dL Final  . Urobilinogen, UA 01/29/2015 1.0  0.0 - 1.0 mg/dL Final  . Nitrite 01/29/2015 NEGATIVE  NEGATIVE Final  . Leukocytes, UA 01/29/2015 LARGE* NEGATIVE Final  . WBC, UA 01/29/2015 TOO NUMEROUS TO COUNT  <3 WBC/hpf Final  . Bacteria, UA 01/29/2015 MANY* RARE Final     X-Rays:Dg Chest 2 View  01/29/2015   CLINICAL DATA:  Patient for preoperative evaluation prior to hip hardware removal.  EXAM: CHEST  2 VIEW  COMPARISON:  Chest radiograph 06/12/2013  FINDINGS: Stable cardiomegaly. Tortuosity of the thoracic aorta. Heterogeneous opacities right lung base. Stable calcified granuloma right lower lung. Apical pleural parenchymal thickening. No pleural effusion or pneumothorax. Posttraumatic deformity distal right clavicle. Mid thoracic spine degenerative changes.  IMPRESSION: No acute cardiopulmonary process.  Cardiomegaly and tortuosity of thoracic aorta.  Probable scarring right lung base.  Posttraumatic deformity right clavicle.   Electronically Signed   By: Lovey Newcomer M.D.   On: 01/29/2015 14:04    EKG: Orders placed or performed in visit on 09/23/14  . EKG 12-Lead     Hospital Course: [name] [name] is a 81 y.o. who was admitted to [facility]. They were brought to the operating room on 02/10/2015 and underwent Procedure(s): REMOVAL  DISTAL SCREW LEFT HIP/FEMUR.  Patient tolerated the procedure well and was later transferred  to the recovery room and then to the orthopaedic floor for postoperative care.  They were given PO and IV analgesics for pain control following their surgery.  They were given 24 hours of postoperative antibiotics of  Anti-infectives    Start     Dose/Rate Route Frequency Ordered Stop   02/10/15 2330  vancomycin (VANCOCIN) IVPB 1000 mg/200 mL premix     1,000 mg 200 mL/hr over 60 Minutes Intravenous Every 12 hours 02/10/15 1636 02/11/15 0006   02/10/15 1315  polymyxin B 500,000 Units, bacitracin 50,000 Units in sodium chloride irrigation 0.9 % 500 mL irrigation  Status:  Discontinued       As needed 02/10/15 1330 02/10/15 1353   02/10/15 1039  vancomycin (VANCOCIN) IVPB 1000 mg/200 mL premix  Status:  Discontinued     1,000 mg 200 mL/hr over 60 Minutes Intravenous On call to O.R. 02/10/15 1039 02/10/15 1620     and started on DVT prophylaxis in the form of Aspirin.   PT and OT were ordered. Discharge planning consulted to help with postop disposition and equipment needs.  Patient had a good night on the evening of surgery.  They started to get up OOB with therapy on day one.  Patient was seen in rounds and was ready to go home.   Diet: Cardiac diet Activity:PWB 50% left LE Follow-up:in 2 weeks Disposition - Home Discharged Condition: stable   Discharge Instructions    Call MD / Call 911    Complete by:  As directed   If you experience chest pain or shortness of breath, CALL 911 and be transported to the hospital emergency room.  If you develope a fever above  97 F, pus (white drainage) or increased drainage or redness at the wound, or calf pain, call your surgeon's office.     Constipation Prevention    Complete by:  As directed   Drink plenty of fluids.  Prune juice may be helpful.  You may use a stool softener, such as Colace (over the counter) 100 mg twice a day.  Use MiraLax (over the counter) for constipation as needed.     Diet - low sodium heart healthy    Complete by:  As  directed      Discharge instructions    Complete by:  As directed   Do not remove dressing unless it appears compromised Dressing is waterproof. You may shower but do not submerge the right hip in water Take aspirin 373m twice daily starting Thursday 02/12/2015 for two weeks Partial weightbearing 50% left leg. Walk with a walker Call if any temperatures greater than 101 or any wound complications: [id number] the day and ask for Dr. GCharlestine Nightnurse, TBrunilda Payor     Increase activity slowly as tolerated    Complete by:  As directed             Medication List    TAKE these medications        aspirin EC 325 MG tablet  Take 1 tablet (325 mg total) by mouth 2 (two) times daily.     benazepril 5 MG tablet  Commonly known as:  LOTENSIN  Take 1 tablet (5 mg total) by mouth daily.     CARTIA XT 240 MG 24 hr capsule  Generic drug:  diltiazem  Take 1 tablet by mouth every morning.     HYDROcodone-acetaminophen 5-325 MG per tablet  Commonly known as:  NORCO/VICODIN  Take 1-2 tablets by mouth every 6 (six) hours as needed for moderate pain.     PRESERVISION AREDS 2 PO  Take 1 tablet by mouth every morning.     sodium chloride 5 % ophthalmic solution  Commonly known as:  MURO 128  Place 1 drop into both eyes 2 (two) times daily.     tamsulosin 0.4 MG Caps capsule  Commonly known as:  FLOMAX  Take 1 capsule by mouth at bedtime.     tiZANidine 4 MG tablet  Commonly known as:  ZANAFLEX  Take 1 tablet (4 mg total) by mouth every 6 (six) hours as needed.     vitamin B-12 1000 MCG tablet  Commonly known as:  CYANOCOBALAMIN  Take 1,000 mcg by mouth every morning.           Follow-up Information    Follow up with GIOFFRE,RONALD A, MD. Schedule an appointment as soon as possible for a visit in 2 weeks.   Specialty:  Orthopedic Surgery   Contact information:   [phone]      Follow up with [facility]   Why:  MElsie Lincolnhas been requested for your physical therapy   Contact information:   [name]      Signed: AArdeen Jourdain PA-C Orthopaedic Surgery 02/13/2015, 7:15 AM

## 2015-04-01 ENCOUNTER — Ambulatory Visit (INDEPENDENT_AMBULATORY_CARE_PROVIDER_SITE_OTHER): Payer: Medicare Other | Admitting: Cardiology

## 2015-04-01 ENCOUNTER — Encounter: Payer: Self-pay | Admitting: Cardiology

## 2015-04-01 VITALS — BP 138/60 | HR 71 | Ht 69.0 in | Wt 146.0 lb

## 2015-04-01 DIAGNOSIS — I10 Essential (primary) hypertension: Secondary | ICD-10-CM

## 2015-04-01 DIAGNOSIS — I481 Persistent atrial fibrillation: Secondary | ICD-10-CM

## 2015-04-01 DIAGNOSIS — I4819 Other persistent atrial fibrillation: Secondary | ICD-10-CM

## 2015-04-01 DIAGNOSIS — I739 Peripheral vascular disease, unspecified: Secondary | ICD-10-CM

## 2015-04-01 DIAGNOSIS — I779 Disorder of arteries and arterioles, unspecified: Secondary | ICD-10-CM | POA: Diagnosis not present

## 2015-04-01 MED ORDER — BENAZEPRIL HCL 5 MG PO TABS: 5.0000 mg | ORAL_TABLET | Freq: Every day | ORAL | Status: AC

## 2015-04-01 NOTE — Patient Instructions (Signed)
Your physician recommends that you continue on your current medications as directed. Please refer to the Current Medication list given to you today.   Your physician wants you to follow-up in: ONE YEAR WITH DR NELSON You will receive a reminder letter in the mail two months in advance. If you don't receive a letter, please call our office to schedule the follow-up appointment.  
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
## 2015-10-07 ENCOUNTER — Ambulatory Visit (INDEPENDENT_AMBULATORY_CARE_PROVIDER_SITE_OTHER): Payer: Medicare Other | Admitting: Urology

## 2015-10-07 DIAGNOSIS — R3915 Urgency of urination: Secondary | ICD-10-CM | POA: Diagnosis not present

## 2015-10-07 DIAGNOSIS — N401 Enlarged prostate with lower urinary tract symptoms: Secondary | ICD-10-CM

## 2015-10-14 ENCOUNTER — Ambulatory Visit (INDEPENDENT_AMBULATORY_CARE_PROVIDER_SITE_OTHER): Payer: Medicare Other | Admitting: Urology

## 2015-10-14 DIAGNOSIS — R3915 Urgency of urination: Secondary | ICD-10-CM | POA: Diagnosis not present

## 2015-10-14 DIAGNOSIS — N401 Enlarged prostate with lower urinary tract symptoms: Secondary | ICD-10-CM | POA: Diagnosis not present

## 2015-11-17 IMAGING — CR DG HIP (WITH OR WITHOUT PELVIS) 2-3V*L*
3 series · 3 of 3 positions shown · non-contrast
Comparison: CT 11/14/2014

CLINICAL DATA: Left hip and leg pain x1 week, without recent trauma

EXAM:
DG HIP (WITH OR WITHOUT PELVIS) 2-3V LEFT

[t pelvis ap]
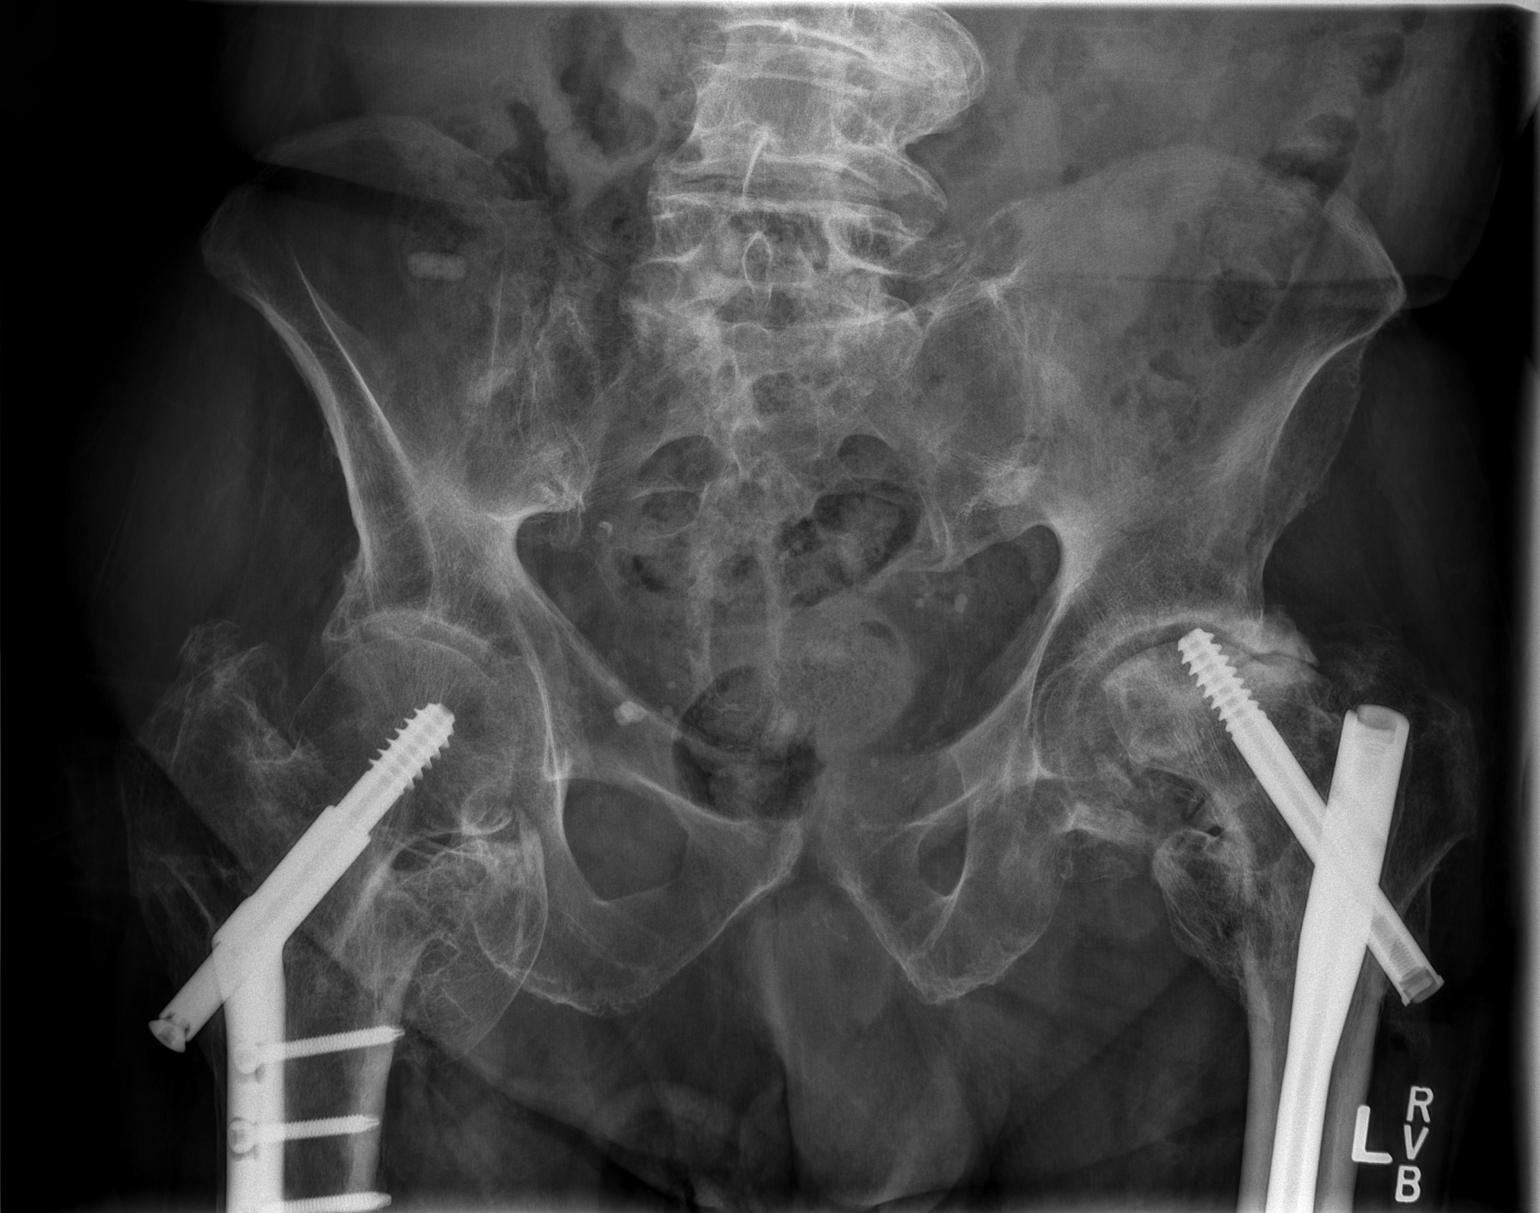

[t hip ap left]
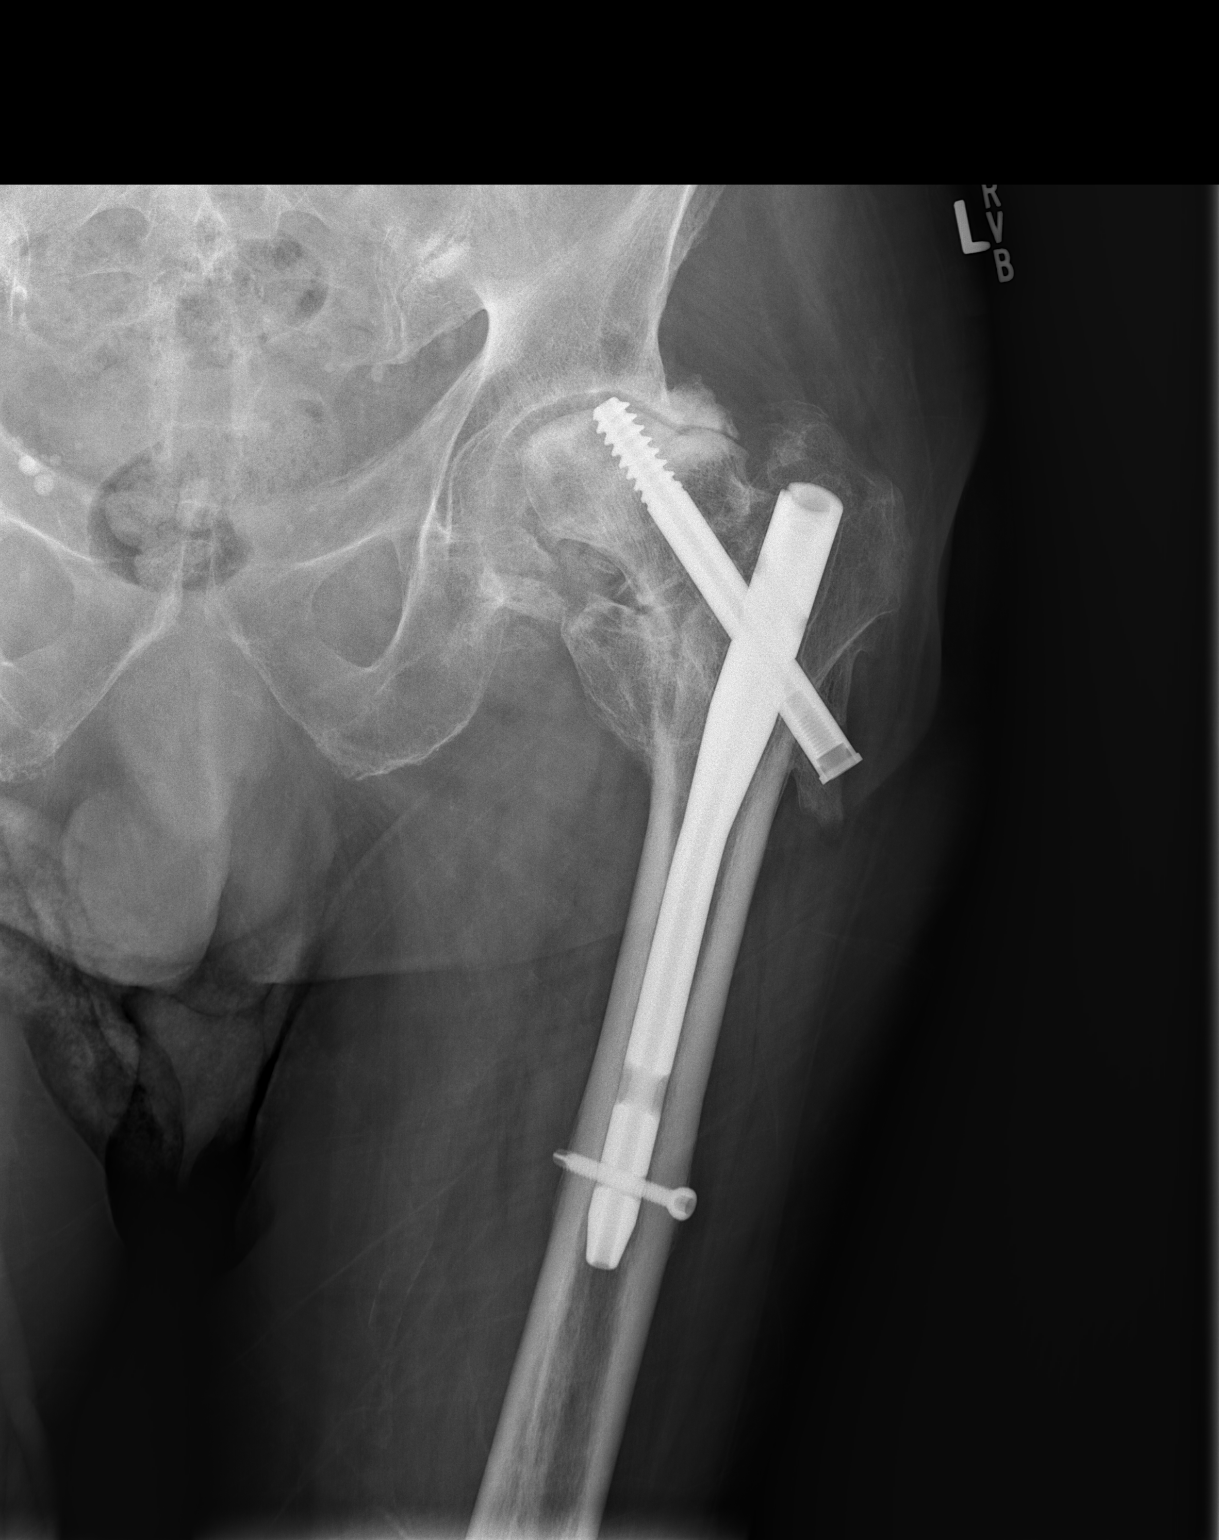

[t hip frog leg left]
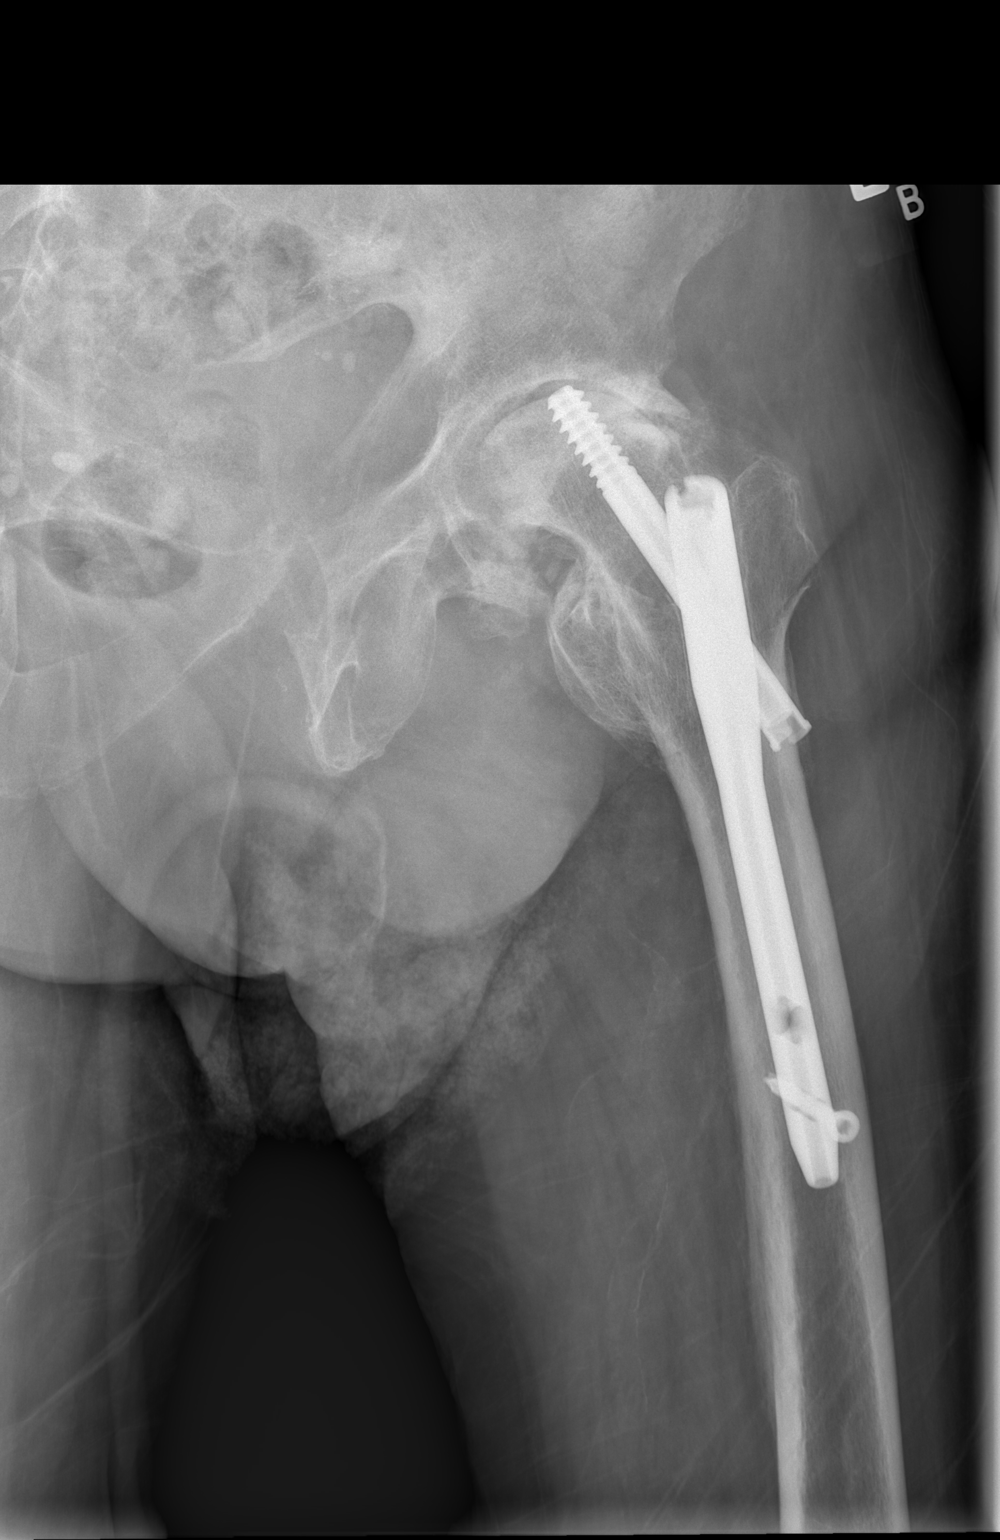

[3 of 3 positions shown; findings below may reference images not displayed]

FINDINGS: There has been sliding screw and IM rod fixation across the left
femoral neck, with a distal interlocking screw. The screw projects
beyond the subchondral cortex of the femoral head into the joint.
There is marked cartilage narrowing in the left hip. There is
remodeling of the femoral head with sclerosis. Spurring from the
acetabulum.

Previous sliding screw and plate fixation across the right femoral
neck, distal hardware not visualized.

Bony pelvis appears intact. No acute fracture or dislocation.
Spondylitic changes in the visualized lower lumbar spine. Bilateral
pelvic phleboliths.
IMPRESSION: 1. Negative for acute fracture or dislocation.
2. Postop and degenerative changes as above.

## 2015-12-16 ENCOUNTER — Ambulatory Visit (INDEPENDENT_AMBULATORY_CARE_PROVIDER_SITE_OTHER): Payer: Medicare Other | Admitting: Urology

## 2015-12-16 DIAGNOSIS — N401 Enlarged prostate with lower urinary tract symptoms: Secondary | ICD-10-CM

## 2015-12-16 DIAGNOSIS — R3915 Urgency of urination: Secondary | ICD-10-CM | POA: Diagnosis not present

## 2016-03-23 ENCOUNTER — Ambulatory Visit: Payer: Medicare Other | Admitting: Urology

## 2016-04-13 DEATH — deceased
